# Patient Record
Sex: Male | Born: 1965 | Race: White | Hispanic: No | Marital: Single | Smoking: Current every day smoker
Health system: Southern US, Community
[De-identification: ages and names within clinical notes are randomized; demographics above are authoritative.]

## PROBLEM LIST (undated history)

## (undated) DIAGNOSIS — I4891 Unspecified atrial fibrillation: Secondary | ICD-10-CM

## (undated) DIAGNOSIS — I82409 Acute embolism and thrombosis of unspecified deep veins of unspecified lower extremity: Secondary | ICD-10-CM

## (undated) DIAGNOSIS — F1010 Alcohol abuse, uncomplicated: Secondary | ICD-10-CM

## (undated) DIAGNOSIS — I219 Acute myocardial infarction, unspecified: Secondary | ICD-10-CM

## (undated) DIAGNOSIS — F10231 Alcohol dependence with withdrawal delirium: Secondary | ICD-10-CM

## (undated) DIAGNOSIS — I739 Peripheral vascular disease, unspecified: Secondary | ICD-10-CM

## (undated) DIAGNOSIS — F10239 Alcohol dependence with withdrawal, unspecified: Secondary | ICD-10-CM

## (undated) DIAGNOSIS — I779 Disorder of arteries and arterioles, unspecified: Secondary | ICD-10-CM

## (undated) HISTORY — DX: Peripheral vascular disease, unspecified: I73.9

## (undated) HISTORY — DX: Disorder of arteries and arterioles, unspecified: I77.9

## (undated) HISTORY — PX: SHOULDER SURGERY: SHX246

---
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
## 2017-06-02 DIAGNOSIS — I219 Acute myocardial infarction, unspecified: Secondary | ICD-10-CM

## 2017-06-02 DIAGNOSIS — I252 Old myocardial infarction: Secondary | ICD-10-CM | POA: Insufficient documentation

## 2017-06-02 HISTORY — DX: Acute myocardial infarction, unspecified: I21.9

## 2018-08-03 DIAGNOSIS — K922 Gastrointestinal hemorrhage, unspecified: Secondary | ICD-10-CM | POA: Insufficient documentation

## 2018-08-03 DIAGNOSIS — F10231 Alcohol dependence with withdrawal delirium: Secondary | ICD-10-CM | POA: Insufficient documentation

## 2018-08-03 DIAGNOSIS — F10931 Alcohol use, unspecified with withdrawal delirium: Secondary | ICD-10-CM

## 2018-08-03 DIAGNOSIS — E871 Hypo-osmolality and hyponatremia: Secondary | ICD-10-CM | POA: Insufficient documentation

## 2018-08-03 DIAGNOSIS — I2510 Atherosclerotic heart disease of native coronary artery without angina pectoris: Secondary | ICD-10-CM | POA: Insufficient documentation

## 2018-08-03 HISTORY — DX: Alcohol use, unspecified with withdrawal delirium: F10.931

## 2018-08-12 ENCOUNTER — Inpatient Hospital Stay (HOSPITAL_COMMUNITY)
Admission: EM | Admit: 2018-08-12 | Discharge: 2018-08-15 | DRG: 300 | Disposition: A | Discharge status: Home / Self Care | Payer: BLUE CROSS/BLUE SHIELD | Attending: Internal Medicine | Admitting: Internal Medicine

## 2018-08-12 ENCOUNTER — Emergency Department (HOSPITAL_COMMUNITY): Payer: BLUE CROSS/BLUE SHIELD

## 2018-08-12 ENCOUNTER — Other Ambulatory Visit: Payer: Self-pay

## 2018-08-12 ENCOUNTER — Encounter (HOSPITAL_COMMUNITY): Payer: Self-pay

## 2018-08-12 DIAGNOSIS — Z720 Tobacco use: Secondary | ICD-10-CM | POA: Diagnosis not present

## 2018-08-12 DIAGNOSIS — G8929 Other chronic pain: Secondary | ICD-10-CM | POA: Diagnosis present

## 2018-08-12 DIAGNOSIS — F10239 Alcohol dependence with withdrawal, unspecified: Secondary | ICD-10-CM | POA: Diagnosis present

## 2018-08-12 DIAGNOSIS — R000 Tachycardia, unspecified: Secondary | ICD-10-CM | POA: Diagnosis present

## 2018-08-12 DIAGNOSIS — F1010 Alcohol abuse, uncomplicated: Secondary | ICD-10-CM | POA: Diagnosis not present

## 2018-08-12 DIAGNOSIS — Z8719 Personal history of other diseases of the digestive system: Secondary | ICD-10-CM | POA: Diagnosis not present

## 2018-08-12 DIAGNOSIS — I82409 Acute embolism and thrombosis of unspecified deep veins of unspecified lower extremity: Secondary | ICD-10-CM | POA: Diagnosis present

## 2018-08-12 DIAGNOSIS — J449 Chronic obstructive pulmonary disease, unspecified: Secondary | ICD-10-CM | POA: Diagnosis present

## 2018-08-12 DIAGNOSIS — F17210 Nicotine dependence, cigarettes, uncomplicated: Secondary | ICD-10-CM | POA: Diagnosis present

## 2018-08-12 DIAGNOSIS — I252 Old myocardial infarction: Secondary | ICD-10-CM

## 2018-08-12 DIAGNOSIS — I82452 Acute embolism and thrombosis of left peroneal vein: Secondary | ICD-10-CM | POA: Diagnosis not present

## 2018-08-12 DIAGNOSIS — R079 Chest pain, unspecified: Secondary | ICD-10-CM | POA: Diagnosis present

## 2018-08-12 DIAGNOSIS — Z79899 Other long term (current) drug therapy: Secondary | ICD-10-CM

## 2018-08-12 DIAGNOSIS — F10230 Alcohol dependence with withdrawal, uncomplicated: Secondary | ICD-10-CM | POA: Diagnosis present

## 2018-08-12 DIAGNOSIS — I82462 Acute embolism and thrombosis of left calf muscular vein: Secondary | ICD-10-CM | POA: Diagnosis present

## 2018-08-12 DIAGNOSIS — K208 Other esophagitis: Secondary | ICD-10-CM | POA: Diagnosis present

## 2018-08-12 DIAGNOSIS — F10939 Alcohol use, unspecified with withdrawal, unspecified: Secondary | ICD-10-CM | POA: Diagnosis present

## 2018-08-12 DIAGNOSIS — F10930 Alcohol use, unspecified with withdrawal, uncomplicated: Secondary | ICD-10-CM

## 2018-08-12 DIAGNOSIS — I4891 Unspecified atrial fibrillation: Secondary | ICD-10-CM | POA: Diagnosis present

## 2018-08-12 HISTORY — DX: Alcohol abuse, uncomplicated: F10.10

## 2018-08-12 HISTORY — DX: Acute myocardial infarction, unspecified: I21.9

## 2018-08-12 HISTORY — DX: Unspecified atrial fibrillation: I48.91

## 2018-08-12 LAB — COMPREHENSIVE METABOLIC PANEL
ALBUMIN: 3.9 g/dL (ref 3.5–5.0)
ALT: 18 U/L (ref 0–44)
ANION GAP: 10 (ref 5–15)
AST: 16 U/L (ref 15–41)
Alkaline Phosphatase: 73 U/L (ref 38–126)
BUN: 6 mg/dL (ref 6–20)
CHLORIDE: 106 mmol/L (ref 98–111)
CO2: 24 mmol/L (ref 22–32)
Calcium: 9 mg/dL (ref 8.9–10.3)
Creatinine, Ser: 0.72 mg/dL (ref 0.61–1.24)
GFR calc Af Amer: >60 mL/min (ref >60)
GFR calc non Af Amer: >60 mL/min (ref >60)
Glucose, Bld: 94 mg/dL (ref 70–99)
Potassium: 4.7 mmol/L (ref 3.5–5.1)
Sodium: 140 mmol/L (ref 135–145)
Total Bilirubin: 0.5 mg/dL (ref 0.3–1.2)
Total Protein: 7.1 g/dL (ref 6.5–8.1)

## 2018-08-12 LAB — CBC WITH DIFFERENTIAL/PLATELET
Abs Immature Granulocytes: 0.02 10*3/uL (ref 0.00–0.07)
BASOS ABS: 0 10*3/uL (ref 0.0–0.1)
Basophils Relative: 1 %
Eosinophils Absolute: 0.1 10*3/uL (ref 0.0–0.5)
Eosinophils Relative: 1 %
HCT: 32.8 % — ABNORMAL LOW (ref 39.0–52.0)
Hemoglobin: 10.5 g/dL — ABNORMAL LOW (ref 13.0–17.0)
Immature Granulocytes: 0 %
Lymphocytes Relative: 34 %
Lymphs Abs: 2.6 10*3/uL (ref 0.7–4.0)
MCH: 31.4 pg (ref 26.0–34.0)
MCHC: 32 g/dL (ref 30.0–36.0)
MCV: 98.2 fL (ref 80.0–100.0)
Monocytes Absolute: 0.6 10*3/uL (ref 0.1–1.0)
Monocytes Relative: 7 %
NRBC: 0 % (ref 0.0–0.2)
Neutro Abs: 4.4 10*3/uL (ref 1.7–7.7)
Neutrophils Relative %: 57 %
Platelets: 478 10*3/uL — ABNORMAL HIGH (ref 150–400)
RBC: 3.34 MIL/uL — AB (ref 4.22–5.81)
RDW: 14 % (ref 11.5–15.5)
WBC: 7.6 10*3/uL (ref 4.0–10.5)

## 2018-08-12 LAB — POCT I-STAT TROPONIN I: Troponin i, poc: 0 ng/mL (ref 0.00–0.08)

## 2018-08-12 LAB — TROPONIN I: Troponin I: <0.03 ng/mL (ref <0.03)

## 2018-08-12 LAB — PROTIME-INR
INR: 0.95
Prothrombin Time: 12.5 seconds (ref 11.4–15.2)

## 2018-08-12 LAB — BRAIN NATRIURETIC PEPTIDE: B NATRIURETIC PEPTIDE 5: 60.4 pg/mL (ref 0.0–100.0)

## 2018-08-12 LAB — ETHANOL: ALCOHOL ETHYL (B): 80 mg/dL — AB (ref <10)

## 2018-08-12 MED ORDER — ONDANSETRON HCL 4 MG PO TABS
4.0000 mg | ORAL_TABLET | Freq: Four times a day (QID) | ORAL | Status: DC | PRN
  Filled 2018-08-12: qty 1

## 2018-08-12 MED ORDER — LORAZEPAM 1 MG PO TABS
1.0000 mg | ORAL_TABLET | Freq: Four times a day (QID) | ORAL | Status: DC | PRN
  Filled 2018-08-12: qty 1

## 2018-08-12 MED ORDER — FOLIC ACID 1 MG PO TABS
1.0000 mg | ORAL_TABLET | Freq: Every day | ORAL | Status: DC
  Administered 2018-08-13 – 2018-08-15 (×3): 1 mg via ORAL
  Filled 2018-08-12 (×3): qty 1

## 2018-08-12 MED ORDER — ADULT MULTIVITAMIN W/MINERALS CH
1.0000 | ORAL_TABLET | Freq: Every day | ORAL | Status: DC
  Administered 2018-08-13 – 2018-08-15 (×3): 1 via ORAL
  Filled 2018-08-12 (×3): qty 1

## 2018-08-12 MED ORDER — VITAMIN B-1 100 MG PO TABS
100.0000 mg | ORAL_TABLET | Freq: Every day | ORAL | Status: DC
  Administered 2018-08-12 – 2018-08-15 (×4): 100 mg via ORAL
  Filled 2018-08-12 (×4): qty 1

## 2018-08-12 MED ORDER — PANTOPRAZOLE SODIUM 40 MG PO TBEC
40.0000 mg | DELAYED_RELEASE_TABLET | Freq: Two times a day (BID) | ORAL | Status: DC
  Administered 2018-08-13 – 2018-08-15 (×6): 40 mg via ORAL
  Filled 2018-08-12 (×6): qty 1

## 2018-08-12 MED ORDER — HEPARIN BOLUS VIA INFUSION
3000.0000 [IU] | Freq: Once | INTRAVENOUS | Status: AC
  Administered 2018-08-12: 3000 [IU] via INTRAVENOUS
  Filled 2018-08-12: qty 3000

## 2018-08-12 MED ORDER — LORAZEPAM 2 MG/ML IJ SOLN
0.0000 mg | Freq: Four times a day (QID) | INTRAMUSCULAR | Status: AC
  Administered 2018-08-12 – 2018-08-13 (×3): 2 mg via INTRAVENOUS
  Administered 2018-08-13: 1 mg via INTRAVENOUS
  Administered 2018-08-14 (×2): 2 mg via INTRAVENOUS
  Filled 2018-08-12 (×6): qty 1

## 2018-08-12 MED ORDER — THIAMINE HCL 100 MG/ML IJ SOLN
100.0000 mg | Freq: Every day | INTRAMUSCULAR | Status: DC
  Filled 2018-08-12: qty 2

## 2018-08-12 MED ORDER — LORAZEPAM 2 MG/ML IJ SOLN
1.0000 mg | Freq: Four times a day (QID) | INTRAMUSCULAR | Status: DC | PRN
  Administered 2018-08-14: 1 mg via INTRAVENOUS
  Filled 2018-08-12: qty 1

## 2018-08-12 MED ORDER — IOPAMIDOL (ISOVUE-370) INJECTION 76%
100.0000 mL | Freq: Once | INTRAVENOUS | Status: AC | PRN
  Administered 2018-08-12: 100 mL via INTRAVENOUS

## 2018-08-12 MED ORDER — HYDROCODONE-ACETAMINOPHEN 5-325 MG PO TABS
1.0000 | ORAL_TABLET | ORAL | Status: DC | PRN
  Administered 2018-08-13: 1 via ORAL
  Administered 2018-08-13 – 2018-08-14 (×3): 2 via ORAL
  Filled 2018-08-12: qty 2
  Filled 2018-08-12: qty 1
  Filled 2018-08-12 (×2): qty 2

## 2018-08-12 MED ORDER — SUCRALFATE 1 GM/10ML PO SUSP
1.0000 g | Freq: Three times a day (TID) | ORAL | Status: DC
  Administered 2018-08-13 – 2018-08-15 (×8): 1 g via ORAL
  Filled 2018-08-12 (×7): qty 10

## 2018-08-12 MED ORDER — ACETAMINOPHEN 325 MG PO TABS: 650.0000 mg | ORAL_TABLET | Freq: Four times a day (QID) | ORAL | Status: DC | PRN

## 2018-08-12 MED ORDER — ACETAMINOPHEN 650 MG RE SUPP: 650.0000 mg | Freq: Four times a day (QID) | RECTAL | Status: DC | PRN

## 2018-08-12 MED ORDER — LORAZEPAM 1 MG PO TABS
0.0000 mg | ORAL_TABLET | Freq: Four times a day (QID) | ORAL | Status: AC
  Administered 2018-08-13: 1 mg via ORAL

## 2018-08-12 MED ORDER — LORAZEPAM 2 MG/ML IJ SOLN: 0.0000 mg | Freq: Two times a day (BID) | INTRAMUSCULAR | Status: DC

## 2018-08-12 MED ORDER — ALBUTEROL SULFATE (2.5 MG/3ML) 0.083% IN NEBU: 2.5000 mg | INHALATION_SOLUTION | RESPIRATORY_TRACT | Status: DC | PRN

## 2018-08-12 MED ORDER — LORAZEPAM 1 MG PO TABS: 0.0000 mg | ORAL_TABLET | Freq: Two times a day (BID) | ORAL | Status: DC

## 2018-08-12 MED ORDER — ONDANSETRON HCL 4 MG/2ML IJ SOLN: 4.0000 mg | Freq: Four times a day (QID) | INTRAMUSCULAR | Status: DC | PRN

## 2018-08-12 MED ORDER — HEPARIN (PORCINE) 25000 UT/250ML-% IV SOLN
1400.0000 [IU]/h | INTRAVENOUS | Status: DC
  Administered 2018-08-12 – 2018-08-13 (×2): 1400 [IU]/h via INTRAVENOUS
  Filled 2018-08-12 (×3): qty 250

## 2018-08-12 NOTE — ED Triage Notes (Signed)
Patient is requesting alcohol detox. Patient's last drink of alcohol was 1 1/2 hours ago. patient states he drank a 24 ounce beer at that time.  Patient c/o left foot pain x 2 days. Patient states he woke up and his left foot was hurting.

## 2018-08-12 NOTE — ED Provider Notes (Signed)
[facility]-EMERGENCY DEPT Provider Note   CSN: [id number] Arrival date & time: 08/12/18  1710     History   Chief Complaint Chief Complaint  Patient presents with  . alcohol detox  . Foot Pain    HPI Randall Simpson is a 53 y.o. male.  HPI   53 year old male with a history of alcohol abuse, with alcohol withdrawal seizures and recent admission to [facility] regional Monday through Friday of last week, presents with concern for foot pain and desire for alcohol detox.  Provider at [facility] regional called regarding a DVT study that have been done earlier today.  Patient reports that he had gone to [facility] regional with distal concern of increasing left foot pain and swelling, and had an ultrasound done.  He was initially discharged with preliminary read showing no acute findings, however the provider at [facility] regional called me to discuss that he does have an acute DVTs. (I believe soleal and peroneal vein)   Reports that the left-sided swelling began on Monday, has been present for 3 days.  Reports that initially he had pain in his left foot, but at this point it has extended into his left calf.  Reports it severe.  Reports that because of the pain he has increased his drinking.  Reports he had been admitted to [facility] regional for alcohol withdrawal, was discharged, however began drinking again.  Reports his last drink was 5 PM.  Reports while he did have a drink today, he already feels symptoms of anxiety, shaking, full body paresthesias, nausea and headache.  Denies visual or auditory hallucinations.  Reports that he "always" has chest pain and shortness of breath.   Past Medical History:  Diagnosis Date  . Atrial fibrillation (HCC)   . ETOH abuse   . MI (myocardial infarction) [facility])     Patient Active Problem List   Diagnosis Date Noted  . DVT (deep venous thrombosis) (HCC) 08/12/2018  . Chest pain 08/12/2018  . Alcohol abuse 08/12/2018  .  Tobacco abuse 08/12/2018  . History of hematemesis 08/12/2018    History reviewed. No pertinent surgical history.      Home Medications    Prior to Admission medications   Medication Sig Start Date End Date Taking? Authorizing Provider  acetaminophen (TYLENOL) 500 MG tablet Take 1,000 mg by mouth every 6 (six) hours as needed for mild pain, moderate pain or headache.   Yes [provider]  pantoprazole (PROTONIX) 40 MG tablet Take 40 mg by mouth daily. 08/07/18  Yes [provider]    Family History Family History  Problem Relation Age of Onset  . Cancer Mother   . Cancer Father     Social History Social History   Tobacco Use  . Smoking status: Current Every Day Smoker    Packs/day: 0.50    Types: Cigarettes  . Smokeless tobacco: Never Used  Substance Use Topics  . Alcohol use: Yes    Comment: daily use-8 pack of wine and several pints of beer and vodka   . Drug use: Never     Allergies   Patient has no known allergies.   Review of Systems Review of Systems  Constitutional: Negative for fever.  HENT: Negative for sore throat.   Eyes: Negative for visual disturbance.  Respiratory: Positive for shortness of breath.   Cardiovascular: Positive for chest pain.  Gastrointestinal: Positive for nausea. Negative for abdominal pain and vomiting.  Genitourinary: Negative for difficulty urinating.  Musculoskeletal: Positive for arthralgias. Negative for back pain and neck stiffness.  Skin: Negative for rash.  Neurological: Positive for headaches. Negative for seizures and syncope.     Physical Exam Updated Vital Signs BP 100/69 (BP Location: Left Arm)   Pulse 82   Temp 98.8 F (37.1 C) (Oral)   Resp 20   Ht 5\' 10"  (1.778 m)   Wt 72 kg   SpO2 97%   BMI 22.78 kg/m   Physical Exam Vitals signs and nursing note reviewed.  Constitutional:      General: He is not in acute distress.    Appearance: He is well-developed. He is not diaphoretic.    HENT:     Head: Normocephalic and atraumatic.  Eyes:     Conjunctiva/sclera: Conjunctivae normal.  Neck:     Musculoskeletal: Normal range of motion.  Cardiovascular:     Rate and Rhythm: Regular rhythm. Tachycardia present.     Heart sounds: Normal heart sounds. No murmur. No friction rub. No gallop.   Pulmonary:     Effort: Pulmonary effort is normal. No respiratory distress.     Breath sounds: Normal breath sounds. No wheezing or rales.  Abdominal:     General: There is no distension.     Palpations: Abdomen is soft.     Tenderness: There is no abdominal tenderness. There is no guarding.  Skin:    General: Skin is warm and dry.  Neurological:     Mental Status: He is alert and oriented to person, place, and time.     Motor: Tremor present.      ED Treatments / Results  Labs (all labs ordered are listed, but only abnormal results are displayed) Labs Reviewed  CBC WITH DIFFERENTIAL/PLATELET - Abnormal; Notable for the following components:      Result Value   RBC 3.34 (*)    Hemoglobin 10.5 (*)    HCT 32.8 (*)    Platelets 478 (*)    All other components within normal limits  ETHANOL - Abnormal; Notable for the following components:   Alcohol, Ethyl (B) 80 (*)    All other components within normal limits  CBC - Abnormal; Notable for the following components:   RBC 3.26 (*)    Hemoglobin 9.9 (*)    HCT 31.4 (*)    Platelets 471 (*)    All other components within normal limits  COMPREHENSIVE METABOLIC PANEL - Abnormal; Notable for the following components:   Calcium 8.8 (*)    Total Protein 6.3 (*)    All other components within normal limits  COMPREHENSIVE METABOLIC PANEL  PROTIME-INR  BRAIN NATRIURETIC PEPTIDE  TROPONIN I  TROPONIN I  TROPONIN I  HEPARIN LEVEL (UNFRACTIONATED)  HIV ANTIBODY (ROUTINE TESTING W REFLEX)  MAGNESIUM  PHOSPHORUS  TSH  HEPARIN LEVEL (UNFRACTIONATED)  I-STAT TROPONIN, ED  POCT I-STAT TROPONIN I  TYPE AND SCREEN  ABO/RH     EKG None  Radiology Ct Angio Chest Pe W And/or Wo Contrast  Result Date: 08/12/2018 CLINICAL DATA:  Chest pain, possible pulmonary emboli EXAM: CT ANGIOGRAPHY CHEST WITH CONTRAST TECHNIQUE: Multidetector CT imaging of the chest was performed using the standard protocol during bolus administration of intravenous contrast. Multiplanar CT image reconstructions and MIPs were obtained to evaluate the vascular anatomy. CONTRAST:  100mL ISOVUE-370 COMPARISON:  None. FINDINGS: Cardiovascular: Thoracic aorta demonstrates no aneurysmal dilatation or dissection. No cardiac enlargement is seen. No coronary calcifications are noted. Pulmonary artery is within normal limits with a normal  branching pattern. No filling defect to suggest pulmonary embolism is identified. Mediastinum/Nodes: Thoracic inlet is within normal limits. No hilar or mediastinal adenopathy is noted. The esophagus is normal limits. Lungs/Pleura: Lungs are well aerated bilaterally. Calcified granuloma is noted in the right upper lobe. No effusion or focal infiltrate is seen. Upper Abdomen: Visualized upper abdomen is unremarkable. Musculoskeletal: Degenerative changes of the thoracic spine are noted. Review of the MIP images confirms the above findings. IMPRESSION: No evidence of pulmonary emboli. Changes consistent with prior granulomatous disease. No acute abnormality noted. Electronically Signed   By: Alcide Clever M.D.   On: 08/12/2018 21:42   Dg Foot Complete Left  Result Date: 08/12/2018 CLINICAL DATA:  23 old male history of pain in the left foot with no injury. EXAM: LEFT FOOT - COMPLETE 3+ VIEW COMPARISON:  None. FINDINGS: There is no evidence of fracture or dislocation. There is no evidence of arthropathy or other focal bone abnormality. Soft tissues are unremarkable. IMPRESSION: Negative. Electronically Signed   By: Gilmer Mor D.O.   On: 08/12/2018 19:06    Procedures Procedures (including critical care  time)  Medications Ordered in ED Medications  LORazepam (ATIVAN) injection 0-4 mg (2 mg Intravenous Given 08/13/18 1423)    Or  LORazepam (ATIVAN) tablet 0-4 mg ( Oral See Alternative 08/13/18 1423)  LORazepam (ATIVAN) injection 0-4 mg (has no administration in time range)    Or  LORazepam (ATIVAN) tablet 0-4 mg (has no administration in time range)  thiamine (VITAMIN B-1) tablet 100 mg (100 mg Oral Given 08/13/18 1046)    Or  thiamine (B-1) injection 100 mg ( Intravenous See Alternative 08/13/18 1046)  LORazepam (ATIVAN) tablet 1 mg (has no administration in time range)    Or  LORazepam (ATIVAN) injection 1 mg (has no administration in time range)  folic acid (FOLVITE) tablet 1 mg (1 mg Oral Given 08/13/18 1046)  multivitamin with minerals tablet 1 tablet (1 tablet Oral Given 08/13/18 1046)  heparin ADULT infusion 100 units/mL (25000 units/259mL sodium chloride 0.45%) (1,400 Units/hr Intravenous New Bag/Given 08/13/18 1415)  pantoprazole (PROTONIX) EC tablet 40 mg (40 mg Oral Given 08/13/18 1046)  acetaminophen (TYLENOL) tablet 650 mg (has no administration in time range)    Or  acetaminophen (TYLENOL) suppository 650 mg (has no administration in time range)  HYDROcodone-acetaminophen (NORCO/VICODIN) 5-325 MG per tablet 1-2 tablet (2 tablets Oral Given 08/13/18 1046)  ondansetron (ZOFRAN) tablet 4 mg (has no administration in time range)    Or  ondansetron (ZOFRAN) injection 4 mg (has no administration in time range)  albuterol (PROVENTIL) (2.5 MG/3ML) 0.083% nebulizer solution 2.5 mg (has no administration in time range)  sucralfate (CARAFATE) 1 GM/10ML suspension 1 g (1 g Oral Not Given 08/13/18 1425)  nicotine (NICODERM CQ - dosed in mg/24 hours) patch 21 mg (21 mg Transdermal Patch Applied 08/13/18 1046)  iopamidol (ISOVUE-370) 76 % injection 100 mL (100 mLs Intravenous Contrast Given 08/12/18 2128)  heparin bolus via infusion 3,000 Units (3,000 Units Intravenous Bolus from Bag 08/12/18 2317)      Initial Impression / Assessment and Plan / ED Course  I have reviewed the triage vital signs and the nursing notes.  Pertinent labs & imaging results that were available during my care of the patient were reviewed by me and considered in my medical decision making (see chart for details).     53 year old male with a history of alcohol abuse, with alcohol withdrawal seizures and recent admission to [facility] regional Monday  through Friday of last week, presents with concern for foot pain and desire for alcohol detox.  [place] regional provider called the emergency department, reports a DVT study that was done earlier today at [facility] regional shows acute DVT.  Patient has tachycardia on exam, describes chest pain and shortness of breath, order CT PE study for further evaluation.   I am concerned regarding the risk of anticoagulation on a patient that has a history of etoh withdrawal seizures when he stops drinking, and risk of falls if he continues. He would like detox from etoh. His current CIWA is a 10.  In addition, he was recently admitted with concern for hematemesis at [facility]--he does not believe he has esophageal varices, has never received blood transfusion, reportedly erosive esophagitis.  Will admit to initiate anticoagulation for DVT and monitor in setting of risks of etoh use, withdrawal and recent bleed.  Final Clinical Impressions(s) / ED Diagnoses   Final diagnoses:  Acute deep vein thrombosis (DVT) of left peroneal vein  Alcohol withdrawal syndrome without complication [facility])    ED Discharge Orders    None       Alvira Monday, MD 08/13/18 1451

## 2018-08-12 NOTE — ED Notes (Addendum)
Pt. Documented in error DG Foot Complete Left.

## 2018-08-12 NOTE — ED Notes (Signed)
ED TO INPATIENT HANDOFF REPORT  Name/Age/Gender Randall Simpson 53 y.o. male  Code Status    Code Status Orders  (From admission, onward)         Start     Ordered   08/12/18 2022  Full code  Continuous     08/12/18 2022        Code Status History    This patient has a current code status but no historical code status.      Home/SNF/Other Home  Chief Complaint alcohol detox, left foot pains  Level of Care/Admitting Diagnosis ED Disposition    ED Disposition Condition Comment   Admit  Hospital Area: [facility] [[id number]]  Level of Care: Telemetry [5]  Admit to tele based on following criteria: Monitor for Ischemic changes  Diagnosis: DVT (deep venous thrombosis) (HCC) [595638]  Admitting Physician: Therisa Doyne [[id number]]  Attending Physician: Therisa Doyne [[id number]]  PT Class (Do Not Modify): Observation [104]  PT Acc Code (Do Not Modify): Observation [10022]       Medical History Past Medical History:  Diagnosis Date  . Atrial fibrillation (HCC)   . ETOH abuse   . MI (myocardial infarction) (HCC)     Allergies No Known Allergies  IV Location/Drains/Wounds Patient Lines/Drains/Airways Status   Active Line/Drains/Airways    Name:   Placement date:   Placement time:   Site:   Days:   Peripheral IV 08/12/18 Right Antecubital   08/12/18    2016    Antecubital   less than 1          Labs/Imaging Results for orders placed or performed during the hospital encounter of 08/12/18 (from the past 48 hour(s))  CBC with Differential     Status: Abnormal   Collection Time: 08/12/18  8:02 PM  Result Value Ref Range   WBC 7.6 4.0 - 10.5 K/uL   RBC 3.34 (L) 4.22 - 5.81 MIL/uL   Hemoglobin 10.5 (L) 13.0 - 17.0 g/dL   HCT 75.6 (L) 43.3 - 29.5 %   MCV 98.2 80.0 - 100.0 fL   MCH 31.4 26.0 - 34.0 pg   MCHC 32.0 30.0 - 36.0 g/dL   RDW 18.8 41.6 - 60.6 %   Platelets 478 (H) 150 - 400 K/uL   nRBC 0.0 0.0 - 0.2 %   Neutrophils Relative %  57 %   Neutro Abs 4.4 1.7 - 7.7 K/uL   Lymphocytes Relative 34 %   Lymphs Abs 2.6 0.7 - 4.0 K/uL   Monocytes Relative 7 %   Monocytes Absolute 0.6 0.1 - 1.0 K/uL   Eosinophils Relative 1 %   Eosinophils Absolute 0.1 0.0 - 0.5 K/uL   Basophils Relative 1 %   Basophils Absolute 0.0 0.0 - 0.1 K/uL   Immature Granulocytes 0 %   Abs Immature Granulocytes 0.02 0.00 - 0.07 K/uL    Comment: Performed at [facility], [address], [place], [place] [address]  Comprehensive metabolic panel     Status: None   Collection Time: 08/12/18  8:02 PM  Result Value Ref Range   Sodium 140 135 - 145 mmol/L   Potassium 4.7 3.5 - 5.1 mmol/L   Chloride 106 98 - 111 mmol/L   CO2 24 22 - 32 mmol/L   Glucose, Bld 94 70 - 99 mg/dL   BUN 6 6 - 20 mg/dL   Creatinine, Ser 1.09 0.61 - 1.24 mg/dL   Calcium 9.0 8.9 - 32.3 mg/dL   Total  Protein 7.1 6.5 - 8.1 g/dL   Albumin 3.9 3.5 - 5.0 g/dL   AST 16 15 - 41 U/L   ALT 18 0 - 44 U/L   Alkaline Phosphatase 73 38 - 126 U/L   Total Bilirubin 0.5 0.3 - 1.2 mg/dL   GFR calc non Af Amer >60 >60 mL/min   GFR calc Af Amer >60 >60 mL/min   Anion gap 10 5 - 15    Comment: Performed at [facility], [address], [place], [place] [address]  Protime-INR     Status: None   Collection Time: 08/12/18  8:02 PM  Result Value Ref Range   Prothrombin Time 12.5 11.4 - 15.2 seconds   INR 0.95     Comment: Performed at [facility], [address], [place], [place] [address]  Brain natriuretic peptide     Status: None   Collection Time: 08/12/18  8:02 PM  Result Value Ref Range   B Natriuretic Peptide 60.4 0.0 - 100.0 pg/mL    Comment: Performed at [facility], [address] [place], [place] [address]  Ethanol     Status: Abnormal   Collection Time: 08/12/18  8:02 PM  Result Value Ref Range   Alcohol, Ethyl (B) 80 (H) <10 mg/dL    Comment: (NOTE) Lowest detectable limit for serum alcohol is  10 mg/dL. For medical purposes only. Performed at [facility], [address] [place], [place] [address]   POCT i-Stat troponin I     Status: None   Collection Time: 08/12/18  8:22 PM  Result Value Ref Range   Troponin i, poc 0.00 0.00 - 0.08 ng/mL   Comment 3            Comment: Due to the release kinetics of cTnI, a negative result within the first hours of the onset of symptoms does not rule out myocardial infarction with certainty. If myocardial infarction is still suspected, repeat the test at appropriate intervals.    Ct Angio Chest Pe W And/or Wo Contrast  Result Date: 08/12/2018 CLINICAL DATA:  Chest pain, possible pulmonary emboli EXAM: CT ANGIOGRAPHY CHEST WITH CONTRAST TECHNIQUE: Multidetector CT imaging of the chest was performed using the standard protocol during bolus administration of intravenous contrast. Multiplanar CT image reconstructions and MIPs were obtained to evaluate the vascular anatomy. CONTRAST:  ISOVUE-370 COMPARISON:  None. FINDINGS: Cardiovascular: Thoracic aorta demonstrates no aneurysmal dilatation or dissection. No cardiac enlargement is seen. No coronary calcifications are noted. Pulmonary artery is within normal limits with a normal branching pattern. No filling defect to suggest pulmonary embolism is identified. Mediastinum/Nodes: Thoracic inlet is within normal limits. No hilar or mediastinal adenopathy is noted. The esophagus is normal limits. Lungs/Pleura: Lungs are well aerated bilaterally. Calcified granuloma is noted in the right upper lobe. No effusion or focal infiltrate is seen. Upper Abdomen: Visualized upper abdomen is unremarkable. Musculoskeletal: Degenerative changes of the thoracic spine are noted. Review of the MIP images confirms the above findings. IMPRESSION: No evidence of pulmonary emboli. Changes consistent with prior granulomatous disease. No acute abnormality noted. Electronically Signed   By: Alcide Clever  M.D.   On: 08/12/2018 21:42   Dg Foot Complete Left  Result Date: 08/12/2018 CLINICAL DATA:  69 old male history of pain in the left foot with no injury. EXAM: LEFT FOOT - COMPLETE 3+ VIEW COMPARISON:  None. FINDINGS: There is no evidence of fracture or dislocation. There is no evidence  of arthropathy or other focal bone abnormality. Soft tissues are unremarkable. IMPRESSION: Negative. Electronically Signed   By: Gilmer Mor D.O.   On: 08/12/2018 19:06    Pending Labs Unresulted Labs (From admission, onward)    Start     Ordered   08/13/18 0800  Heparin level (unfractionated)  Once-Timed,   STAT     08/12/18 2241   08/13/18 0500  CBC  Daily,   R     08/12/18 2241   08/12/18 2234  Troponin I - Now Then Q6H  Now then every 6 hours,   R     08/12/18 2233   08/12/18 2233  Type and screen [facility]  Once,   R    Comments:  [facility]    08/12/18 2232   Signed and Held  HIV antibody (Routine Testing)  Tomorrow morning,   R     Signed and Held   Signed and Held  Magnesium  Tomorrow morning,   R    Comments:  Call MD if <1.5    Signed and Held   Signed and Held  Phosphorus  Tomorrow morning,   R     Signed and Held   Signed and Held  TSH  Once,   R    Comments:  Cancel if already done within 1 month and notify MD    Signed and Held   Signed and Held  Comprehensive metabolic panel  Once,   R    Comments:  Cal MD for K<3.5 or >5.0    Signed and Held   Signed and Held  CBC  Once,   R    Comments:  Call for hg <8.0    Signed and Held          Vitals/Pain Today's Vitals   08/12/18 1759 08/12/18 2115 08/12/18 2200 08/12/18 2200  BP: 114/82 103/72 106/76 106/76  Pulse: (!) 115 87 88 89  Resp:  18 18   Temp:      TempSrc:      SpO2:  96% 96%   Weight:      Height:      PainSc:        Isolation Precautions No active isolations  Medications Medications  LORazepam (ATIVAN) injection 0-4 mg (2 mg Intravenous Given 08/12/18  2215)    Or  LORazepam (ATIVAN) tablet 0-4 mg ( Oral See Alternative 08/12/18 2215)  LORazepam (ATIVAN) injection 0-4 mg (has no administration in time range)    Or  LORazepam (ATIVAN) tablet 0-4 mg (has no administration in time range)  thiamine (VITAMIN B-1) tablet 100 mg (100 mg Oral Given 08/12/18 2215)    Or  thiamine (B-1) injection 100 mg ( Intravenous See Alternative 08/12/18 2215)  heparin ADULT infusion 100 units/mL (25000 units/230mL sodium chloride 0.45%) (1,400 Units/hr Intravenous New Bag/Given 08/12/18 2317)  iopamidol (ISOVUE-370) 76 % injection 100 mL (100 mLs Intravenous Contrast Given 08/12/18 2128)  heparin bolus via infusion 3,000 Units (3,000 Units Intravenous Bolus from Bag 08/12/18 2317)    Mobility walks

## 2018-08-12 NOTE — ED Notes (Signed)
Patient states he normally has around 10 drinks per day. He drinks wine, vodka, and beer. His last drink was at 5 pm today, he had a 24 oz beer at 5 PM. Before that he had 4 other 24 oz beers earlier today for a total of 5 24 ox beers.

## 2018-08-12 NOTE — ED Notes (Signed)
Report given to Florentina Addison, RN on 4E.

## 2018-08-12 NOTE — H&P (Signed)
Randall Simpson HBZ:[id number] DOB: [birth year] DOA: 08/12/2018     PCP: Patient, No Pcp Per   Outpatient Specialists:  NONE   Patient arrived to ER on 08/12/18 at 1710  Patient coming from: home Lives at hotel    Chief Complaint:  Chief Complaint  Patient presents with  . alcohol detox  . Foot Pain    HPI: Randall Simpson is a 53 y.o. male with medical history significant of Hematemesis secondary to severe grade D erosive esophagitis COPD, ?MI in 06/19/2018, current alcohol abuse, tobacco abuse Atrial fibrillation    Presented with   rest for alcohol detox last alcoholic drink was today.  Also reported 2-day history of left foot pain.  Had an ultrasound done Doppler showed acute DVT Been treating his pain with alcohol.  Since he has not drank anything since this morning he started to have some shakes and anxiety paresthesias nausea and headache.  No hallucinations. Recently admitted at [facility] for hematemesis and ETOH withdrawal was admitted form 3 to 7th of February   he was diagnosed with hematemesis secondary to erosive esophagitis.  Started on Protonix twice a day    Reports last week had a possible seizure he woke up in an ambulance not sure how he got there.  Reports no bleeding since last week  No recent falls since prior discharge. Had neg head CT last week    Reports have quit drinking in the past and stayed sober for months Reports  Chest pain worse with exertion has been going on for a while  Reports MI last year  Continues to smoke   Reports chronic chest pains drinking at least 6 beers a day which is less than 1 month ago when he was drinking beer and vodka and wine daily.      While in ER: HR 115 The following Work up has been ordered so far:  Orders Placed This Encounter  Procedures  . DG Foot Complete Left  . CT Angio Chest PE W and/or Wo Contrast  . CBC with Differential  . Comprehensive metabolic panel  . Protime-INR  . Brain  natriuretic peptide  . Ethanol  . Diet regular Room service appropriate? Yes; Fluid consistency: Thin  . Apply ice to affected area (if injury is <48 hours old)  . Immobilize affected extremity  . Remove jewelry  . Vital signs  . Notify physician - Call MD / PA if:  . Initiate Carrier Fluid Protocol  . Clinical institute withdrawal assessment every 6 hours X 48 hours, then every 12 hours x 48 hours  . Notify MD if CIWA-AR is greater than 10 for 2 consecutive assessments.  . May administer Ativan PO versus IV if patient is tolerating PO intake well  . Vital signs every 6 hours X 48 hours, then per unit protocol  . Full code  . Consult to hospitalist  . I-Stat Troponin, ED (not at [facility])  . POCT i-Stat troponin I  . ED EKG      Following Medications were ordered in ER: Medications  LORazepam (ATIVAN) injection 0-4 mg (has no administration in time range)    Or  LORazepam (ATIVAN) tablet 0-4 mg (has no administration in time range)  LORazepam (ATIVAN) injection 0-4 mg (has no administration in time range)    Or  LORazepam (ATIVAN) tablet 0-4 mg (has no administration in time range)  thiamine (VITAMIN B-1) tablet 100 mg (has no administration in time range)    Or  thiamine (B-1) injection 100 mg (has no administration in time range)  iopamidol (ISOVUE-370) 76 % injection 100 mL (100 mLs Intravenous Contrast Given 08/12/18 2128)    Significant initial  Findings: Abnormal Labs Reviewed  CBC WITH DIFFERENTIAL/PLATELET - Abnormal; Notable for the following components:      Result Value   RBC 3.34 (*)    Hemoglobin 10.5 (*)    HCT 32.8 (*)    Platelets 478 (*)    All other components within normal limits  ETHANOL - Abnormal; Notable for the following components:   Alcohol, Ethyl (B) 80 (*)    All other components within normal limits    Trop 0.0  Lactic Acid, Venous No results found for: LATICACIDVEN  Na 140 K 4.7  Cr    Stable,  Lab Results  Component Value Date    CREATININE 0.72 08/12/2018     WBC  7.6  HG/HCT  stable,       Component Value Date/Time   HGB 10.5 (L) 08/12/2018 2002   HCT 32.8 (L) 08/12/2018 2002      Troponin (Point of Care Test) Recent Labs    08/12/18 2022  TROPIPOC 0.00     BNP (last 3 results) Recent Labs    08/12/18 2002  BNP 60.4    ProBNP (last 3 results) No results for input(s): PROBNP in the last 8760 hours.   UA  not ordered    CT A CHEST NO PE LEFT foot neg C   ECG:  Personally reviewed by me showing: HR : 81 Rhythm:  NSR   no evidence of ischemic changes QTC 407      ED Triage Vitals  Enc Vitals Group     BP 08/12/18 1735 114/82     Pulse Rate 08/12/18 1735 (!) 115     Resp 08/12/18 1735 20     Temp 08/12/18 1735 99.2 F (37.3 C)     Temp Source 08/12/18 1735 Oral     SpO2 08/12/18 1735 100 %     Weight 08/12/18 1753 165 lb (74.8 kg)     Height 08/12/18 1753 5\' 10"  (1.778 m)     Head Circumference --      Peak Flow --      Pain Score 08/12/18 1753 10     Pain Loc --      Pain Edu? --      Excl. in GC? --   TMAX(24)@       Latest  Blood pressure 106/76, pulse 89, temperature 99.2 F (37.3 C), temperature source Oral, resp. rate 18, height 5\' 10"  (1.778 m), weight 74.8 kg, SpO2 96 %.     Hospitalist was called for admission for DVT and ETOH ABUSE with recent admission for hematemesis   Review of Systems:    Pertinent positives include: leg pain anxiety tremor   Constitutional:  No weight loss, night sweats, Fevers, chills, fatigue, weight loss  HEENT:  No headaches, Difficulty swallowing,Tooth/dental problems,Sore throat,  No sneezing, itching, ear ache, nasal congestion, post nasal drip,  Cardio-vascular:  No chest pain, Orthopnea, PND, anasarca, dizziness, palpitations.no Bilateral lower extremity swelling  GI:  No heartburn, indigestion, abdominal pain, nausea, vomiting, diarrhea, change in bowel habits, loss of appetite, melena, blood in stool, hematemesis Resp:    no shortness of breath at rest. No dyspnea on exertion, No excess mucus, no productive cough, No non-productive cough, No coughing up of blood.No change in color of mucus.No wheezing. Skin:  no rash  or lesions. No jaundice GU:  no dysuria, change in color of urine, no urgency or frequency. No straining to urinate.  No flank pain.  Musculoskeletal:  No joint pain or no joint swelling. No decreased range of motion. No back pain.  Psych:  No change in mood or affect. No depression or anxiety. No memory loss.  Neuro: no localizing neurological complaints, no tingling, no weakness, no double vision, no gait abnormality, no slurred speech, no confusion  All systems reviewed and apart from HOPI all are negative  Past Medical History:   Past Medical History:  Diagnosis Date  . Atrial fibrillation (HCC)   . ETOH abuse   . MI (myocardial infarction) (HCC)       History reviewed. No pertinent surgical history.  Social History:  Ambulatory  independently      reports that he has been smoking cigarettes. He has been smoking about 0.50 packs per day. He has never used smokeless tobacco. He reports current alcohol use. He reports that he does not use drugs.     Family History:  Family History  Problem Relation Age of Onset  . Cancer Mother   . Cancer Father     Allergies: No Known Allergies   Prior to Admission medications   Medication Sig Start Date End Date Taking? Authorizing Provider  acetaminophen (TYLENOL) 500 MG tablet Take 1,000 mg by mouth every 6 (six) hours as needed for mild pain, moderate pain or headache.   Yes [provider]  pantoprazole (PROTONIX) 40 MG tablet Take 40 mg by mouth daily. 08/07/18  Yes [provider]   Physical Exam: Blood pressure 106/76, pulse 89, temperature 99.2 F (37.3 C), temperature source Oral, resp. rate 18, height 5\' 10"  (1.778 m), weight 74.8 kg, SpO2 96 %. 1. General:  in No Acute distress   well  -appearing 2.  Psychological: Alert and   Oriented 3. Head/ENT:   Dry Mucous Membranes                          Head Non traumatic, neck supple                            Poor Dentition 4. SKIN:   decreased Skin turgor,  Skin clean Dry and intact no rash 5. Heart: Regular rate and rhythm no  Murmur, no Rub or gallop 6. Lungs:   no wheezes occasional  crackles   7. Abdomen: Soft,  Epigastric tenderness, Non distended  bowel sounds present 8. Lower extremities: no clubbing, cyanosis, no   edema 9. Neurologically Grossly intact, moving all 4 extremities equally  tremors 10. MSK: Normal range of motion   LABS:     Recent Labs  Lab 08/12/18 2002  WBC 7.6  NEUTROABS 4.4  HGB 10.5*  HCT 32.8*  MCV 98.2  PLT 478*   Basic Metabolic Panel: Recent Labs  Lab 08/12/18 2002  NA 140  K 4.7  CL 106  CO2 24  GLUCOSE 94  BUN 6  CREATININE 0.72  CALCIUM 9.0      Recent Labs  Lab 08/12/18 2002  AST 16  ALT 18  ALKPHOS 73  BILITOT 0.5  PROT 7.1  ALBUMIN 3.9   No results for input(s): LIPASE, AMYLASE in the last 168 hours. No results for input(s): AMMONIA in the last 168 hours.    HbA1C: No results for input(s): HGBA1C in the  last 72 hours. CBG: No results for input(s): GLUCAP in the last 168 hours.    Urine analysis: No results found for: COLORURINE, APPEARANCEUR, LABSPEC, PHURINE, GLUCOSEU, HGBUR, BILIRUBINUR, KETONESUR, PROTEINUR, UROBILINOGEN, NITRITE, LEUKOCYTESUR     Cultures: No results found for: SDES, SPECREQUEST, CULT, REPTSTATUS   Radiological Exams on Admission: Ct Angio Chest Pe W And/or Wo Contrast  Result Date: 08/12/2018 CLINICAL DATA:  Chest pain, possible pulmonary emboli EXAM: CT ANGIOGRAPHY CHEST WITH CONTRAST TECHNIQUE: Multidetector CT imaging of the chest was performed using the standard protocol during bolus administration of intravenous contrast. Multiplanar CT image reconstructions and MIPs were obtained to evaluate the vascular anatomy. CONTRAST:   ISOVUE-370 COMPARISON:  None. FINDINGS: Cardiovascular: Thoracic aorta demonstrates no aneurysmal dilatation or dissection. No cardiac enlargement is seen. No coronary calcifications are noted. Pulmonary artery is within normal limits with a normal branching pattern. No filling defect to suggest pulmonary embolism is identified. Mediastinum/Nodes: Thoracic inlet is within normal limits. No hilar or mediastinal adenopathy is noted. The esophagus is normal limits. Lungs/Pleura: Lungs are well aerated bilaterally. Calcified granuloma is noted in the right upper lobe. No effusion or focal infiltrate is seen. Upper Abdomen: Visualized upper abdomen is unremarkable. Musculoskeletal: Degenerative changes of the thoracic spine are noted. Review of the MIP images confirms the above findings. IMPRESSION: No evidence of pulmonary emboli. Changes consistent with prior granulomatous disease. No acute abnormality noted. Electronically Signed   By: Alcide Clever M.D.   On: 08/12/2018 21:42   Dg Foot Complete Left  Result Date: 08/12/2018 CLINICAL DATA:  61 old male history of pain in the left foot with no injury. EXAM: LEFT FOOT - COMPLETE 3+ VIEW COMPARISON:  None. FINDINGS: There is no evidence of fracture or dislocation. There is no evidence of arthropathy or other focal bone abnormality. Soft tissues are unremarkable. IMPRESSION: Negative. Electronically Signed   By: Gilmer Mor D.O.   On: 08/12/2018 19:06    Chart has been reviewed    Assessment/Plan  52 y.o. male with medical history significant of Hematemesis secondary to severe grade D erosive esophagitis COPD, ?MI in 06/19/2018, current alcohol abuse, tobacco abuse Atrial fibrillation  Admitted for new DVT in the setting of recent hematemesis and alcohol withdrawal  Present on Admission: . DVT (deep venous thrombosis) (HCC)-DVT in the setting of recent hospitalization.  Given recent GI bleed secondary to erosive esophagitis monitor carefully  with initiation of blood failure.  Spoke about of importance of quitting alcohol.  Would need to observe IV heparin to make sure patient does not develop any further episodes of hematemesis admission for hematemesis was last week patient since then had no further episodes  . Chest pain chronic patient states he has had recent MI.  Unclear regarding his history will cycle cardiac enzymes and monitor on telemetry, given complaints of chest pain with exertion obtain echogram to evaluate for wall motion normalities . Alcohol abuse -CIWA protocol . Tobacco abuse -  - Spoke about importance of quitting spent 5 minutes discussing options for treatment, prior attempts at quitting, and dangers of smoking  -At this point patient is    interested in quitting  - order nicotine patch   - nursing tobacco cessation protocol    Other plan as per orders.  DVT prophylaxis:  heparin    Code Status:  FULL CODE   as per patient  I had personally discussed CODE STATUS with patient    Family Communication:   Family not at  Bedside  Disposition Plan:      To home once workup is complete and patient is stable                                       Social Work  consulted                 Consults called:  none  Admission status:  Obs   Level of care    tele  For 12H     Randall Simpson 08/12/2018, 11:10 PM   [facility]     after 2 AM please page floor coverage PA If 7AM-7PM, please contact the day team taking care of the patient using [email or web address]

## 2018-08-12 NOTE — ED Notes (Signed)
EKG given to EDP,Schlossman,MD., for review. 

## 2018-08-12 NOTE — ED Notes (Signed)
Heparin bolus verified with [employer], RN.

## 2018-08-13 ENCOUNTER — Observation Stay (HOSPITAL_BASED_OUTPATIENT_CLINIC_OR_DEPARTMENT_OTHER): Payer: BLUE CROSS/BLUE SHIELD

## 2018-08-13 DIAGNOSIS — F1010 Alcohol abuse, uncomplicated: Secondary | ICD-10-CM | POA: Diagnosis not present

## 2018-08-13 DIAGNOSIS — Z8719 Personal history of other diseases of the digestive system: Secondary | ICD-10-CM | POA: Diagnosis not present

## 2018-08-13 DIAGNOSIS — R079 Chest pain, unspecified: Secondary | ICD-10-CM | POA: Diagnosis not present

## 2018-08-13 DIAGNOSIS — Z720 Tobacco use: Secondary | ICD-10-CM

## 2018-08-13 LAB — CBC
HCT: 31.4 % — ABNORMAL LOW (ref 39.0–52.0)
HEMOGLOBIN: 9.9 g/dL — AB (ref 13.0–17.0)
MCH: 30.4 pg (ref 26.0–34.0)
MCHC: 31.5 g/dL (ref 30.0–36.0)
MCV: 96.3 fL (ref 80.0–100.0)
Platelets: 471 10*3/uL — ABNORMAL HIGH (ref 150–400)
RBC: 3.26 MIL/uL — ABNORMAL LOW (ref 4.22–5.81)
RDW: 14.1 % (ref 11.5–15.5)
WBC: 5.5 10*3/uL (ref 4.0–10.5)
nRBC: 0 % (ref 0.0–0.2)

## 2018-08-13 LAB — COMPREHENSIVE METABOLIC PANEL
ALBUMIN: 3.5 g/dL (ref 3.5–5.0)
ALT: 16 U/L (ref 0–44)
AST: 15 U/L (ref 15–41)
Alkaline Phosphatase: 74 U/L (ref 38–126)
Anion gap: 6 (ref 5–15)
BUN: 9 mg/dL (ref 6–20)
CHLORIDE: 107 mmol/L (ref 98–111)
CO2: 25 mmol/L (ref 22–32)
Calcium: 8.8 mg/dL — ABNORMAL LOW (ref 8.9–10.3)
Creatinine, Ser: 0.69 mg/dL (ref 0.61–1.24)
GFR calc Af Amer: >60 mL/min (ref >60)
GFR calc non Af Amer: >60 mL/min (ref >60)
Glucose, Bld: 98 mg/dL (ref 70–99)
Potassium: 4 mmol/L (ref 3.5–5.1)
SODIUM: 138 mmol/L (ref 135–145)
Total Bilirubin: 0.8 mg/dL (ref 0.3–1.2)
Total Protein: 6.3 g/dL — ABNORMAL LOW (ref 6.5–8.1)

## 2018-08-13 LAB — TYPE AND SCREEN
ABO/RH(D): O POS
Antibody Screen: NEGATIVE

## 2018-08-13 LAB — TROPONIN I
Troponin I: <0.03 ng/mL (ref <0.03)
Troponin I: <0.03 ng/mL (ref <0.03)

## 2018-08-13 LAB — HEPARIN LEVEL (UNFRACTIONATED)
Heparin Unfractionated: 0.58 IU/mL (ref 0.30–0.70)
Heparin Unfractionated: 0.49 IU/mL (ref 0.30–0.70)

## 2018-08-13 LAB — ECHOCARDIOGRAM COMPLETE
Height: 70 in
Weight: 2539.7 oz

## 2018-08-13 LAB — MAGNESIUM: Magnesium: 2.3 mg/dL (ref 1.7–2.4)

## 2018-08-13 LAB — ABO/RH: ABO/RH(D): O POS

## 2018-08-13 LAB — TSH: TSH: 2.41 u[IU]/mL (ref 0.350–4.500)

## 2018-08-13 LAB — PHOSPHORUS: Phosphorus: 4.4 mg/dL (ref 2.5–4.6)

## 2018-08-13 LAB — HIV ANTIBODY (ROUTINE TESTING W REFLEX): HIV Screen 4th Generation wRfx: NONREACTIVE

## 2018-08-13 MED ORDER — NICOTINE 21 MG/24HR TD PT24
21.0000 mg | MEDICATED_PATCH | Freq: Every day | TRANSDERMAL | Status: DC
  Administered 2018-08-13 – 2018-08-15 (×3): 21 mg via TRANSDERMAL
  Filled 2018-08-13 (×3): qty 1

## 2018-08-13 NOTE — Progress Notes (Signed)
ANTICOAGULATION CONSULT NOTE   Pharmacy Consult for heparin Indication: acute DVT  Labs: Recent Labs    08/12/18 2002 08/12/18 2234 08/13/18 0553 08/13/18 0810 08/13/18 1016 08/13/18 1344  HGB 10.5*  --  9.9*  --   --   --   HCT 32.8*  --  31.4*  --   --   --   PLT 478*  --  471*  --   --   --   LABPROT 12.5  --   --   --   --   --   INR 0.95  --   --   --   --   --   HEPARINUNFRC  --   --   --  0.58  --  0.49  CREATININE 0.72  --  0.69  --   --   --   TROPONINI  --  <0.03 <0.03  --  <0.03  --      Assessment: 52 yoM on no anticoagulation PTA, admitted to [facility] 2/3 - 2/7 for EtOH w/drawal and hematemesis 2/2 erosive esophagitis. Presented to [facility] ED 2/12 for withdrawal and c/o L foot pain. Doppler done at [facility] showed acute DVT (per EDP notes, he was initially discharged with preliminary read showing no acute findings, however the provider at [facility] called on 2/12 to discuss that he does have acute soleal and peroneal DVTs). CTA negative for PE, and Pharmacy consulted to dose heparin infusion on admission for DVT.   Baseline INR normal, aPTT not done  Prior anticoagulation: none  Heparin dosing weight: 72 kg  Significant events:  Today, 08/13/2018:  CBC: Hgb low; Plt slightly elevated  Confirmatory heparin level therapeutic on 1400 units/hr  No bleeding or infusion issues per nursing  Goal of Therapy: Heparin level 0.3-0.7 units/ml Monitor platelets by anticoagulation protocol: Yes  Plan:  Continue heparin IV infusion at 1400 units/hr  Daily CBC and heparin level  Monitor for signs of bleeding or thrombosis   Bernadene Person, PharmD, BCPS [phone] 08/13/2018, 3:23 PM

## 2018-08-13 NOTE — Progress Notes (Signed)
ANTICOAGULATION CONSULT NOTE   Pharmacy Consult for heparin Indication: acute DVT  No Known Allergies  Patient Measurements: Height: 5\' 10"  (177.8 cm) Weight: 158 lb 11.7 oz (72 kg) IBW/kg (Calculated) : 73 Heparin Dosing Weight: 72 kg  Vital Signs: Temp: 98.8 F (37.1 C) (02/13 0503) Temp Source: Oral (02/13 0503) BP: 102/68 (02/13 0503) Pulse Rate: 78 (02/13 0503)  Labs: Recent Labs    08/12/18 2002 08/12/18 2234 08/13/18 0553 08/13/18 0810  HGB 10.5*  --  9.9*  --   HCT 32.8*  --  31.4*  --   PLT 478*  --  471*  --   LABPROT 12.5  --   --   --   INR 0.95  --   --   --   HEPARINUNFRC  --   --   --  0.58  CREATININE 0.72  --  0.69  --   TROPONINI  --  <0.03 <0.03  --     Estimated Creatinine Clearance: 107.5 mL/min (by C-G formula based on SCr of 0.69 mg/dL).   Medications:  -   Assessment: Patient is a 53 y.o recently hospitalized at [facility] a week ago for alcohol withdrawal and treated for hematemesis secondary to erosive esophagitis. He presented to the ED on 2/12 for alcohol detox and with c/o left foot pain. LE doppler at [facility] was positive for acute DVT. Heparin drip started on admission for DVT.   -2/12:  chest CTA neg for PE  Today, 08/13/2018: - first heparin level now back therapeutic at 0.58 - hgb down slightly to 9.9 - no active bleeding documented  Goal of Therapy:  Heparin level 0.3-0.7 units/ml (will aim for lower end to middle of therapeutic range d/t recent hematemesis) Monitor platelets by anticoagulation protocol: Yes   Plan:  - continue heparin drip at 1400 units/hr - recheck another heparin level at 2PM to confirm level is still within desired range before changing to daily monitoring - monitor for s/s bleeding  Randall Simpson P 08/13/2018,9:16 AM

## 2018-08-13 NOTE — Progress Notes (Signed)
PROGRESS NOTE    Randall Simpson  JKD:[id number] DOB: 04-12-1966 DOA: 08/12/2018 PCP: Patient, No Pcp Per    Brief Narrative:  53 y.o. male with medical history significant of Hematemesis secondary to severe grade D erosive esophagitis COPD, ?MI in 06/19/2018, current alcohol abuse, tobacco abuse Atrial fibrillation    Presented with   rest for alcohol detox last alcoholic drink was today.  Also reported 2-day history of left foot pain.  Had an ultrasound done Doppler showed acute DVT Been treating his pain with alcohol.  Since he has not drank anything since this morning he started to have some shakes and anxiety paresthesias nausea and headache.  No hallucinations. Recently admitted at [facility] for hematemesis and ETOH withdrawal was admitted form 3 to 7th of February   he was diagnosed with hematemesis secondary to erosive esophagitis.  Started on Protonix twice a day    Reports last week had a possible seizure he woke up in an ambulance not sure how he got there.  Reports no bleeding since last week  No recent falls since prior discharge. Had neg head CT last week    Reports have quit drinking in the past and stayed sober for months Reports  Chest pain worse with exertion has been going on for a while  Reports MI last year  Assessment & Plan:   Principal Problem:   DVT (deep venous thrombosis) (HCC) Active Problems:   Chest pain   Alcohol abuse   Tobacco abuse   History of hematemesis   . DVT (deep venous thrombosis) (HCC) - DVT in the setting of recent hospitalization.   -Noted to have recent GI bleed secondary to erosive esophagitis -Presently remains on heparin gtt, tolerating thus far. If no evidence of bleeding x 24hrs of heparin, then would consider transition to oral agent at that time  . Chest pain chronic patient states he has had recent MI.   - Serial troponin neg x 3 - 2d echo performed, reviewed. Unremarkable  . Alcohol abuse  - -Presented with CIWA inexcess of 16. -This AM, CIWA noted to be 5, still with shaking on exam -Continue CIWA protocol -Cessation done at bedside. Patient states that his recent admissions have encouraged him to quit his ETOH habits and that he is keen on quitting further alcohol abuse -Discussed with Case manager regarding providing information about ETOH resource programs  . Tobacco abuse -   - Tobacco cessation done at time of admission - Stable at present  DVT prophylaxis: Heparin gtt Code Status: Full Family Communication: Pt in room, family not at bedside Disposition Plan: Uncertain at this time  Consultants:     Procedures:     Antimicrobials: Anti-infectives (From admission, onward)   None       Subjective: Still shaking this AM  Objective: Vitals:   08/12/18 2347 08/12/18 2348 08/13/18 0503 08/13/18 1325  BP: 118/76  102/68 100/69  Pulse: 80  78 82  Resp: 19  17 20   Temp: 98.9 F (37.2 C)  98.8 F (37.1 C) 98.8 F (37.1 C)  TempSrc: Oral  Oral Oral  SpO2: 98%  97% 97%  Weight:  72 kg    Height:  5\' 10"  (1.778 m)      Intake/Output Summary (Last 24 hours) at 08/13/2018 1353 Last data filed at 08/13/2018 1319 Gross per 24 hour  Intake 1127.14 ml  Output 875 ml  Net 252.14 ml   Filed Weights   08/12/18 1753 08/12/18 2348  Weight: 74.8 kg 72 kg    Examination:  General exam: Appears calm and comfortable  Respiratory system: Clear to auscultation. Respiratory effort normal. Cardiovascular system: S1 & S2 heard, RRR. Gastrointestinal system: Abdomen is nondistended, soft and nontender. No organomegaly or masses felt. Normal bowel sounds heard. Central nervous system: Alert and oriented. No focal neurological deficits. Resting tremor noted Extremities: Symmetric 5 x 5 power. Skin: No rashes, lesions Psychiatry: Judgement and insight appear normal. Mood & affect appropriate.   Data Reviewed: I have personally reviewed following labs and imaging  studies  CBC: Recent Labs  Lab 08/12/18 2002 08/13/18 0553  WBC 7.6 5.5  NEUTROABS 4.4  --   HGB 10.5* 9.9*  HCT 32.8* 31.4*  MCV 98.2 96.3  PLT 478* 471*   Basic Metabolic Panel: Recent Labs  Lab 08/12/18 2002 08/13/18 0553  NA 140 138  K 4.7 4.0  CL 106 107  CO2 24 25  GLUCOSE 94 98  BUN 6 9  CREATININE 0.72 0.69  CALCIUM 9.0 8.8*  MG  --  2.3  PHOS  --  4.4   GFR: Estimated Creatinine Clearance: 107.5 mL/min (by C-G formula based on SCr of 0.69 mg/dL). Liver Function Tests: Recent Labs  Lab 08/12/18 2002 08/13/18 0553  AST 16 15  ALT 18 16  ALKPHOS 73 74  BILITOT 0.5 0.8  PROT 7.1 6.3*  ALBUMIN 3.9 3.5   No results for input(s): LIPASE, AMYLASE in the last 168 hours. No results for input(s): AMMONIA in the last 168 hours. Coagulation Profile: Recent Labs  Lab 08/12/18 2002  INR 0.95   Cardiac Enzymes: Recent Labs  Lab 08/12/18 2234 08/13/18 0553 08/13/18 1016  TROPONINI <0.03 <0.03 <0.03   BNP (last 3 results) No results for input(s): PROBNP in the last 8760 hours. HbA1C: No results for input(s): HGBA1C in the last 72 hours. CBG: No results for input(s): GLUCAP in the last 168 hours. Lipid Profile: No results for input(s): CHOL, HDL, LDLCALC, TRIG, CHOLHDL, LDLDIRECT in the last 72 hours. Thyroid Function Tests: Recent Labs    08/13/18 0552  TSH 2.410   Anemia Panel: No results for input(s): VITAMINB12, FOLATE, FERRITIN, TIBC, IRON, RETICCTPCT in the last 72 hours. Sepsis Labs: No results for input(s): PROCALCITON, LATICACIDVEN in the last 168 hours.  No results found for this or any previous visit (from the past 240 hour(s)).   Radiology Studies: Ct Angio Chest Pe W And/or Wo Contrast  Result Date: 08/12/2018 CLINICAL DATA:  Chest pain, possible pulmonary emboli EXAM: CT ANGIOGRAPHY CHEST WITH CONTRAST TECHNIQUE: Multidetector CT imaging of the chest was performed using the standard protocol during bolus administration of  intravenous contrast. Multiplanar CT image reconstructions and MIPs were obtained to evaluate the vascular anatomy. CONTRAST:  ISOVUE-370 COMPARISON:  None. FINDINGS: Cardiovascular: Thoracic aorta demonstrates no aneurysmal dilatation or dissection. No cardiac enlargement is seen. No coronary calcifications are noted. Pulmonary artery is within normal limits with a normal branching pattern. No filling defect to suggest pulmonary embolism is identified. Mediastinum/Nodes: Thoracic inlet is within normal limits. No hilar or mediastinal adenopathy is noted. The esophagus is normal limits. Lungs/Pleura: Lungs are well aerated bilaterally. Calcified granuloma is noted in the right upper lobe. No effusion or focal infiltrate is seen. Upper Abdomen: Visualized upper abdomen is unremarkable. Musculoskeletal: Degenerative changes of the thoracic spine are noted. Review of the MIP images confirms the above findings. IMPRESSION: No evidence of pulmonary emboli. Changes consistent with prior granulomatous disease. No acute abnormality  noted. Electronically Signed   By: Alcide CleverMark  Lukens M.D.   On: 08/12/2018 21:42   Dg Foot Complete Left  Result Date: 08/12/2018 CLINICAL DATA:  27Fifty-four old male history of pain in the left foot with no injury. EXAM: LEFT FOOT - COMPLETE 3+ VIEW COMPARISON:  None. FINDINGS: There is no evidence of fracture or dislocation. There is no evidence of arthropathy or other focal bone abnormality. Soft tissues are unremarkable. IMPRESSION: Negative. Electronically Signed   By: Gilmer MorJaime  Wagner D.O.   On: 08/12/2018 19:06    Scheduled Meds: . folic acid  1 mg Oral Daily  . LORazepam  0-4 mg Intravenous Q6H   Or  . LORazepam  0-4 mg Oral Q6H  . [START ON 08/15/2018] LORazepam  0-4 mg Intravenous Q12H   Or  . [START ON 08/15/2018] LORazepam  0-4 mg Oral Q12H  . multivitamin with minerals  1 tablet Oral Daily  . nicotine  21 mg Transdermal Daily  . pantoprazole  40 mg Oral BID  . sucralfate   1 g Oral TID WC & HS  . thiamine  100 mg Oral Daily   Or  . thiamine  100 mg Intravenous Daily   Continuous Infusions: . heparin 1,400 Units/hr (08/13/18 0200)     LOS: 0 days   Rickey BarbaraStephen Tyan Lasure, MD [facility] Pager On Amion  If 7PM-7AM, please contact night-coverage 08/13/2018, 1:53 PM

## 2018-08-14 DIAGNOSIS — F10939 Alcohol use, unspecified with withdrawal, unspecified: Secondary | ICD-10-CM | POA: Diagnosis present

## 2018-08-14 DIAGNOSIS — F10230 Alcohol dependence with withdrawal, uncomplicated: Secondary | ICD-10-CM

## 2018-08-14 DIAGNOSIS — F10239 Alcohol dependence with withdrawal, unspecified: Secondary | ICD-10-CM

## 2018-08-14 DIAGNOSIS — I82452 Acute embolism and thrombosis of left peroneal vein: Principal | ICD-10-CM

## 2018-08-14 DIAGNOSIS — R079 Chest pain, unspecified: Secondary | ICD-10-CM | POA: Diagnosis not present

## 2018-08-14 HISTORY — DX: Alcohol use, unspecified with withdrawal, unspecified: F10.939

## 2018-08-14 HISTORY — DX: Alcohol dependence with withdrawal, unspecified: F10.239

## 2018-08-14 LAB — COMPREHENSIVE METABOLIC PANEL
ALBUMIN: 3.4 g/dL — AB (ref 3.5–5.0)
ALT: 15 U/L (ref 0–44)
AST: 15 U/L (ref 15–41)
Alkaline Phosphatase: 66 U/L (ref 38–126)
Anion gap: 9 (ref 5–15)
BUN: 8 mg/dL (ref 6–20)
CO2: 24 mmol/L (ref 22–32)
Calcium: 8.8 mg/dL — ABNORMAL LOW (ref 8.9–10.3)
Chloride: 105 mmol/L (ref 98–111)
Creatinine, Ser: 0.69 mg/dL (ref 0.61–1.24)
GFR calc Af Amer: >60 mL/min (ref >60)
GFR calc non Af Amer: >60 mL/min (ref >60)
Glucose, Bld: 101 mg/dL — ABNORMAL HIGH (ref 70–99)
POTASSIUM: 3.9 mmol/L (ref 3.5–5.1)
Sodium: 138 mmol/L (ref 135–145)
Total Bilirubin: 0.8 mg/dL (ref 0.3–1.2)
Total Protein: 6.6 g/dL (ref 6.5–8.1)

## 2018-08-14 LAB — CBC
HCT: 33.8 % — ABNORMAL LOW (ref 39.0–52.0)
Hemoglobin: 10.9 g/dL — ABNORMAL LOW (ref 13.0–17.0)
MCH: 31.8 pg (ref 26.0–34.0)
MCHC: 32.2 g/dL (ref 30.0–36.0)
MCV: 98.5 fL (ref 80.0–100.0)
NRBC: 0 % (ref 0.0–0.2)
PLATELETS: 448 10*3/uL — AB (ref 150–400)
RBC: 3.43 MIL/uL — ABNORMAL LOW (ref 4.22–5.81)
RDW: 14 % (ref 11.5–15.5)
WBC: 5.8 10*3/uL (ref 4.0–10.5)

## 2018-08-14 LAB — HEPARIN LEVEL (UNFRACTIONATED): Heparin Unfractionated: 0.67 IU/mL (ref 0.30–0.70)

## 2018-08-14 MED ORDER — HEPARIN (PORCINE) 25000 UT/250ML-% IV SOLN: 1350.0000 [IU]/h | INTRAVENOUS | Status: AC

## 2018-08-14 MED ORDER — RIVAROXABAN 15 MG PO TABS
15.0000 mg | ORAL_TABLET | Freq: Two times a day (BID) | ORAL | Status: DC
  Administered 2018-08-14 – 2018-08-15 (×3): 15 mg via ORAL
  Filled 2018-08-14 (×3): qty 1

## 2018-08-14 MED ORDER — RIVAROXABAN 20 MG PO TABS: 20.0000 mg | ORAL_TABLET | Freq: Every day | ORAL | Status: DC

## 2018-08-14 NOTE — Evaluation (Signed)
Physical Therapy Evaluation Patient Details Name: Randall Simpson MRN: [id number] DOB: 1966/02/18 Today's Date: 08/14/2018   History of Present Illness  53 y.o.malewith medical history significant of Hematemesis secondary to severe grade D erosive esophagitis, COPD, ?MI in 06/19/2018, current alcohol abuse, tobacco abuse and admitted for alcohol detox and found to have acute DVT  Clinical Impression  Pt admitted with above diagnosis. Pt currently with functional limitations due to the deficits listed below (see PT Problem List).  Pt will benefit from skilled PT to increase their independence and safety with mobility to allow discharge to the venue listed below.  Pt presents with antalgic gait due to L foot pain however gait improved with use of RW.  Recommend RW upon d/c.     Follow Up Recommendations No PT follow up    Equipment Recommendations  Rolling walker with 5" wheels    Recommendations for Other Services       Precautions / Restrictions Precautions Precautions: Fall      Mobility  Bed Mobility Overal bed mobility: Needs Assistance Bed Mobility: Supine to Sit;Sit to Supine     Supine to sit: Supervision Sit to supine: Supervision      Transfers Overall transfer level: Needs assistance Equipment used: None Transfers: Sit to/from Stand Sit to Stand: Min guard         General transfer comment: min/guard for safety - fall mat in place and initially did not use RW  Ambulation/Gait Ambulation/Gait assistance: Min guard Gait Distance (Feet): 200 Feet Assistive device: Rolling walker (2 wheeled) Gait Pattern/deviations: Step-to pattern;Decreased stance time - left;Decreased step length - right;Antalgic     General Gait Details: very antalgic gait observed due to L foot pain and pt attempting to hold hand rail, pt declined SPC however was agreeable to RW, steadiness and antalgic gait improved with use of RW  Stairs            Wheelchair Mobility     Modified Rankin (Stroke Patients Only)       Balance Overall balance assessment: Needs assistance         Standing balance support: No upper extremity supported Standing balance-Leahy Scale: Fair                               Pertinent Vitals/Pain Pain Assessment: 0-10 Pain Score: 4  Pain Location: L foot Pain Descriptors / Indicators: Sore Pain Intervention(s): Monitored during session;Limited activity within patient's tolerance;Repositioned    Home Living Family/patient expects to be discharged to:: Other (Comment)                 Additional Comments: pt reports discharging someplace with someone - vague on details but reports he may have steps and will have assist available    Prior Function Level of Independence: Independent               Hand Dominance        Extremity/Trunk Assessment        Lower Extremity Assessment Lower Extremity Assessment: LLE deficits/detail LLE Deficits / Details: reports L foot pain, was initially told he had plantar fasciitis and then found DVT, moving LE withing functional limits       Communication   Communication: No difficulties  Cognition Arousal/Alertness: Awake/alert Behavior During Therapy: WFL for tasks assessed/performed Overall Cognitive Status: Within Functional Limits for tasks assessed  General Comments      Exercises     Assessment/Plan    PT Assessment Patient needs continued PT services  PT Problem List Decreased mobility;Decreased balance;Decreased knowledge of use of DME;Pain;Decreased safety awareness       PT Treatment Interventions Functional mobility training;DME instruction;Gait training;Neuromuscular re-education;Therapeutic activities;Stair training;Therapeutic exercise;Balance training;Patient/family education    PT Goals (Current goals can be found in the Care Plan section)  Acute Rehab PT Goals PT Goal  Formulation: With patient Time For Goal Achievement: 08/21/18 Potential to Achieve Goals: Good    Frequency Min 3X/week   Barriers to discharge        Co-evaluation               AM-PAC PT "6 Clicks" Mobility  Outcome Measure Help needed turning from your back to your side while in a flat bed without using bedrails?: None Help needed moving from lying on your back to sitting on the side of a flat bed without using bedrails?: None Help needed moving to and from a bed to a chair (including a wheelchair)?: None Help needed standing up from a chair using your arms (e.g., wheelchair or bedside chair)?: A Little Help needed to walk in hospital room?: A Little Help needed climbing 3-5 steps with a railing? : A Little 6 Click Score: 21    End of Session Equipment Utilized During Treatment: Gait belt Activity Tolerance: Patient tolerated treatment well Patient left: in bed;with call bell/phone within reach;with bed alarm set Nurse Communication: Mobility status PT Visit Diagnosis: Difficulty in walking, not elsewhere classified (R26.2)    Time: 1446-1500 PT Time Calculation (min) (ACUTE ONLY): 14 min   Charges:   PT Evaluation $PT Eval Low Complexity: 1 Low         Zenovia Jarred, PT, DPT Acute Rehabilitation Services Office: [phone] Pager: [phone]  [name] 08/14/2018, 3:17 PM

## 2018-08-14 NOTE — Discharge Instructions (Signed)
Information on my medicine - XARELTO (rivaroxaban)  This medication education was reviewed with me or my healthcare representative as part of my discharge preparation.  The pharmacist that spoke with me during my hospital stay was:    WHY WAS XARELTO PRESCRIBED FOR YOU? Xarelto was prescribed to treat blood clots that may have been found in the veins of your legs (deep vein thrombosis) or in your lungs (pulmonary embolism) and to reduce the risk of them occurring again.  What do you need to know about Xarelto? The starting dose is one 15 mg tablet taken TWICE daily with food for the FIRST 21 DAYS then on March 6th, 2020  the dose is changed to one 20 mg tablet taken ONCE A DAY with your evening meal.  DO NOT stop taking Xarelto without talking to the health care provider who prescribed the medication.  Refill your prescription for 20 mg tablets before you run out.  After discharge, you should have regular check-up appointments with your healthcare provider that is prescribing your Xarelto.  In the future your dose may need to be changed if your kidney function changes by a significant amount.  What do you do if you miss a dose? If you are taking Xarelto TWICE DAILY and you miss a dose, take it as soon as you remember. You may take two 15 mg tablets (total 30 mg) at the same time then resume your regularly scheduled 15 mg twice daily the next day.  If you are taking Xarelto ONCE DAILY and you miss a dose, take it as soon as you remember on the same day then continue your regularly scheduled once daily regimen the next day. Do not take two doses of Xarelto at the same time.   Important Safety Information Xarelto is a blood thinner medicine that can cause bleeding. You should call your healthcare provider right away if you experience any of the following: ? Bleeding from an injury or your nose that does not stop. ? Unusual colored urine (red or dark brown) or unusual colored stools (red  or black). ? Unusual bruising for unknown reasons. ? A serious fall or if you hit your head (even if there is no bleeding).  Some medicines may interact with Xarelto and might increase your risk of bleeding while on Xarelto. To help avoid this, consult your healthcare provider or pharmacist prior to using any new prescription or non-prescription medications, including herbals, vitamins, non-steroidal anti-inflammatory drugs (NSAIDs) and supplements.  This website has more information on Xarelto: VisitDestination.com.br.

## 2018-08-14 NOTE — Progress Notes (Signed)
PROGRESS NOTE    Randall Simpson  XIP:[id number] DOB: 07/16/1965 DOA: 08/12/2018 PCP: Patient, No Pcp Per    Brief Narrative:  53 y.o. male with medical history significant of Hematemesis secondary to severe grade D erosive esophagitis COPD, ?MI in 06/19/2018, current alcohol abuse, tobacco abuse Atrial fibrillation    Presented with   rest for alcohol detox last alcoholic drink was today.  Also reported 2-day history of left foot pain.  Had an ultrasound done Doppler showed acute DVT Been treating his pain with alcohol.  Since he has not drank anything since this morning he started to have some shakes and anxiety paresthesias nausea and headache.  No hallucinations. Recently admitted at [facility] for hematemesis and ETOH withdrawal was admitted form 3 to 7th of February   he was diagnosed with hematemesis secondary to erosive esophagitis.  Started on Protonix twice a day    Reports last week had a possible seizure he woke up in an ambulance not sure how he got there.  Reports no bleeding since last week  No recent falls since prior discharge. Had neg head CT last week    Reports have quit drinking in the past and stayed sober for months Reports  Chest pain worse with exertion has been going on for a while  Reports MI last year  Assessment & Plan:   Principal Problem:   DVT (deep venous thrombosis) (HCC) Active Problems:   Chest pain   Alcohol abuse   Tobacco abuse   History of hematemesis   Alcohol withdrawal (HCC)   . DVT (deep venous thrombosis) (HCC) - DVT in the setting of recent hospitalization.   -Noted to have recent GI bleed secondary to erosive esophagitis -Presently remains on heparin gtt, tolerating thus far. No signs of acute bleed -Will transition to PO xarelto  . Chest pain chronic patient states he has had recent MI.   - Serial troponin neg x 3 - 2d echo performed, reviewed. Unremarkable findings  . Alcohol abuse - -Presented with  CIWA inexcess of 16. -This AM, CIWA noted to be 16, shaking on exam -Continue CIWA protocol -Cessation done at bedside. Patient states that his recent admissions have encouraged him to quit his ETOH habits and that he is keen on quitting further alcohol abuse -Discussed with Case manager regarding providing information about ETOH resource programs  . Tobacco abuse -   - Tobacco cessation done at time of admission - Stable at present  DVT prophylaxis: Xarelto Code Status: Full Family Communication: Pt in room, family not at bedside Disposition Plan: Uncertain at this time  Consultants:     Procedures:     Antimicrobials: Anti-infectives (From admission, onward)   None      Subjective: Complaining of shaking this AM  Objective: Vitals:   08/13/18 1325 08/13/18 1857 08/13/18 2337 08/14/18 0629  BP: 100/69 104/65 111/68 108/72  Pulse: 82 87 74 85  Resp: 20 20 18 16   Temp: 98.8 F (37.1 C) 99.3 F (37.4 C) 98 F (36.7 C) 98.4 F (36.9 C)  TempSrc: Oral Oral Oral Oral  SpO2: 97% 93% 96% 94%  Weight:      Height:        Intake/Output Summary (Last 24 hours) at 08/14/2018 1427 Last data filed at 08/14/2018 0600 Gross per 24 hour  Intake 401.68 ml  Output 650 ml  Net -248.32 ml   Filed Weights   08/12/18 1753 08/12/18 2348  Weight: 74.8 kg 72 kg  Examination: General exam: Awake, laying in bed, in nad Respiratory system: Normal respiratory effort, no wheezing Cardiovascular system: regular rate, s1, s2 Gastrointestinal system: Soft, nondistended, positive BS Central nervous system: CN2-12 grossly intact, strength intact Extremities: Perfused, no clubbing Skin: Normal skin turgor, no notable skin lesions seen Psychiatry: Mood normal // no visual hallucinations   Data Reviewed: I have personally reviewed following labs and imaging studies  CBC: Recent Labs  Lab 08/12/18 2002 08/13/18 0553 08/14/18 0538  WBC 7.6 5.5 5.8  NEUTROABS 4.4  --   --     HGB 10.5* 9.9* 10.9*  HCT 32.8* 31.4* 33.8*  MCV 98.2 96.3 98.5  PLT 478* 471* 448*   Basic Metabolic Panel: Recent Labs  Lab 08/12/18 2002 08/13/18 0553 08/14/18 0538  NA 140 138 138  K 4.7 4.0 3.9  CL 106 107 105  CO2 24 25 24   GLUCOSE 94 98 101*  BUN 6 9 8   CREATININE 0.72 0.69 0.69  CALCIUM 9.0 8.8* 8.8*  MG  --  2.3  --   PHOS  --  4.4  --    GFR: Estimated Creatinine Clearance: 107.5 mL/min (by C-G formula based on SCr of 0.69 mg/dL). Liver Function Tests: Recent Labs  Lab 08/12/18 2002 08/13/18 0553 08/14/18 0538  AST 16 15 15   ALT 18 16 15   ALKPHOS 73 74 66  BILITOT 0.5 0.8 0.8  PROT 7.1 6.3* 6.6  ALBUMIN 3.9 3.5 3.4*   No results for input(s): LIPASE, AMYLASE in the last 168 hours. No results for input(s): AMMONIA in the last 168 hours. Coagulation Profile: Recent Labs  Lab 08/12/18 2002  INR 0.95   Cardiac Enzymes: Recent Labs  Lab 08/12/18 2234 08/13/18 0553 08/13/18 1016  TROPONINI <0.03 <0.03 <0.03   BNP (last 3 results) No results for input(s): PROBNP in the last 8760 hours. HbA1C: No results for input(s): HGBA1C in the last 72 hours. CBG: No results for input(s): GLUCAP in the last 168 hours. Lipid Profile: No results for input(s): CHOL, HDL, LDLCALC, TRIG, CHOLHDL, LDLDIRECT in the last 72 hours. Thyroid Function Tests: Recent Labs    08/13/18 0552  TSH 2.410   Anemia Panel: No results for input(s): VITAMINB12, FOLATE, FERRITIN, TIBC, IRON, RETICCTPCT in the last 72 hours. Sepsis Labs: No results for input(s): PROCALCITON, LATICACIDVEN in the last 168 hours.  No results found for this or any previous visit (from the past 240 hour(s)).   Radiology Studies: Ct Angio Chest Pe W And/or Wo Contrast  Result Date: 08/12/2018 CLINICAL DATA:  Chest pain, possible pulmonary emboli EXAM: CT ANGIOGRAPHY CHEST WITH CONTRAST TECHNIQUE: Multidetector CT imaging of the chest was performed using the standard protocol during bolus  administration of intravenous contrast. Multiplanar CT image reconstructions and MIPs were obtained to evaluate the vascular anatomy. CONTRAST:  ISOVUE-370 COMPARISON:  None. FINDINGS: Cardiovascular: Thoracic aorta demonstrates no aneurysmal dilatation or dissection. No cardiac enlargement is seen. No coronary calcifications are noted. Pulmonary artery is within normal limits with a normal branching pattern. No filling defect to suggest pulmonary embolism is identified. Mediastinum/Nodes: Thoracic inlet is within normal limits. No hilar or mediastinal adenopathy is noted. The esophagus is normal limits. Lungs/Pleura: Lungs are well aerated bilaterally. Calcified granuloma is noted in the right upper lobe. No effusion or focal infiltrate is seen. Upper Abdomen: Visualized upper abdomen is unremarkable. Musculoskeletal: Degenerative changes of the thoracic spine are noted. Review of the MIP images confirms the above findings. IMPRESSION: No evidence of pulmonary emboli.  Changes consistent with prior granulomatous disease. No acute abnormality noted. Electronically Signed   By: Alcide Clever M.D.   On: 08/12/2018 21:42   Dg Foot Complete Left  Result Date: 08/12/2018 CLINICAL DATA:  26 old male history of pain in the left foot with no injury. EXAM: LEFT FOOT - COMPLETE 3+ VIEW COMPARISON:  None. FINDINGS: There is no evidence of fracture or dislocation. There is no evidence of arthropathy or other focal bone abnormality. Soft tissues are unremarkable. IMPRESSION: Negative. Electronically Signed   By: Gilmer Mor D.O.   On: 08/12/2018 19:06    Scheduled Meds: . folic acid  1 mg Oral Daily  . LORazepam  0-4 mg Intravenous Q6H   Or  . LORazepam  0-4 mg Oral Q6H  . [START ON 08/15/2018] LORazepam  0-4 mg Intravenous Q12H   Or  . [START ON 08/15/2018] LORazepam  0-4 mg Oral Q12H  . multivitamin with minerals  1 tablet Oral Daily  . nicotine  21 mg Transdermal Daily  . pantoprazole  40 mg Oral  BID  . rivaroxaban  15 mg Oral BID WC   Followed by  . [START ON 09/04/2018] rivaroxaban  20 mg Oral Q supper  . sucralfate  1 g Oral TID WC & HS  . thiamine  100 mg Oral Daily   Or  . thiamine  100 mg Intravenous Daily   Continuous Infusions:    LOS: 0 days   Rickey Barbara, MD [facility] Pager On Amion  If 7PM-7AM, please contact night-coverage 08/14/2018, 2:27 PM

## 2018-08-14 NOTE — Progress Notes (Signed)
ANTICOAGULATION CONSULT NOTE   Pharmacy Consult for heparin --> rivaroxaban Indication: acute DVT  No Known Allergies  Patient Measurements: Height: 5\' 10"  (177.8 cm) Weight: 158 lb 11.7 oz (72 kg) IBW/kg (Calculated) : 73 Heparin Dosing Weight: 72 kg  Vital Signs: Temp: 98.4 F (36.9 C) (02/14 0629) Temp Source: Oral (02/14 0629) BP: 108/72 (02/14 0629) Pulse Rate: 85 (02/14 0629)  Labs: Recent Labs    08/12/18 2002 08/12/18 2234 08/13/18 0553 08/13/18 0810 08/13/18 1016 08/13/18 1344 08/14/18 0538  HGB 10.5*  --  9.9*  --   --   --  10.9*  HCT 32.8*  --  31.4*  --   --   --  33.8*  PLT 478*  --  471*  --   --   --  448*  LABPROT 12.5  --   --   --   --   --   --   INR 0.95  --   --   --   --   --   --   HEPARINUNFRC  --   --   --  0.58  --  0.49 0.67  CREATININE 0.72  --  0.69  --   --   --  0.69  TROPONINI  --  <0.03 <0.03  --  <0.03  --   --     Estimated Creatinine Clearance: 107.5 mL/min (by C-G formula based on SCr of 0.69 mg/dL).   Medications:  No anticoagulants PTA  Assessment: Patient is a 53 y.o recently hospitalized at [facility] a week ago for alcohol withdrawal and treated for hematemesis secondary to erosive esophagitis. He presented to the ED on 2/12 for alcohol detox and with c/o left foot pain. LE doppler at [facility] was positive for acute DVT. Heparin drip started on admission for DVT. Pharmacy consulted this morning to convert from heparin drip to rivaroxaban.   -2/12:  chest CTA neg for PE  Today, 08/14/2018:  Hgb 10.9 - increased  Plt - stable  Wt = 72 kg, SCr = 0.7 - WNL.   HL = 0.67 therapeutic this morning, but on upper end of therapeutic range on heparin infusion of 1400 units/hr. Rate reduced to 1350 units/hr.   Goal of Therapy:  Monitor platelets by anticoagulation protocol: Yes   Plan:   Discontinue heparin drip and initiate rivaroxaban at time of heparin drip discontinuation this morning  Rivaroxaban  15 mg PO BID with meals x 21 days followed by 20 mg PO daily with supper.  Monitor renal function  Monitor for signs/symptoms of bleeding  Manufacturer coupon and medication counseling provided by pharmacy on 2/14  Cindi Carbon, PharmD 08/14/18

## 2018-08-14 NOTE — Progress Notes (Signed)
Clinical Social Worker following patient for support and discharge needs. Patient stated he lives in [place] and was on his way back to [place] from [place] and became sick. Patient stated he was sober from may 2019 to January 2020 but relapse when he went to [place] to visit some friends during the holiday. Patient stated he is aware that he needs to be sober again to be able to take care of his health. Patient stated he has connected himself with a program in the area called "[facility] Patient stated he will discharge to one of [facility] house after discharge and receive help for his substance abuse. Patient stated that Annette Stable will pick him up from the hospital once medically stable. CSW signing off as patient has safe discharge plan and has set up follow up for substance abuse.   Marrianne Mood, MSW,  Theresia Majors [phone]

## 2018-08-15 DIAGNOSIS — I82452 Acute embolism and thrombosis of left peroneal vein: Secondary | ICD-10-CM | POA: Diagnosis not present

## 2018-08-15 DIAGNOSIS — F10230 Alcohol dependence with withdrawal, uncomplicated: Secondary | ICD-10-CM | POA: Diagnosis not present

## 2018-08-15 LAB — CBC
HCT: 34.7 % — ABNORMAL LOW (ref 39.0–52.0)
Hemoglobin: 10.9 g/dL — ABNORMAL LOW (ref 13.0–17.0)
MCH: 30.6 pg (ref 26.0–34.0)
MCHC: 31.4 g/dL (ref 30.0–36.0)
MCV: 97.5 fL (ref 80.0–100.0)
Platelets: 465 10*3/uL — ABNORMAL HIGH (ref 150–400)
RBC: 3.56 MIL/uL — ABNORMAL LOW (ref 4.22–5.81)
RDW: 13.7 % (ref 11.5–15.5)
WBC: 5.9 10*3/uL (ref 4.0–10.5)
nRBC: 0 % (ref 0.0–0.2)

## 2018-08-15 LAB — COMPREHENSIVE METABOLIC PANEL
ALT: 16 U/L (ref 0–44)
AST: 18 U/L (ref 15–41)
Albumin: 3.6 g/dL (ref 3.5–5.0)
Alkaline Phosphatase: 75 U/L (ref 38–126)
Anion gap: 6 (ref 5–15)
BUN: 12 mg/dL (ref 6–20)
CO2: 26 mmol/L (ref 22–32)
Calcium: 8.9 mg/dL (ref 8.9–10.3)
Chloride: 106 mmol/L (ref 98–111)
Creatinine, Ser: 0.61 mg/dL (ref 0.61–1.24)
GFR calc Af Amer: >60 mL/min (ref >60)
GFR calc non Af Amer: >60 mL/min (ref >60)
Glucose, Bld: 95 mg/dL (ref 70–99)
Potassium: 4.1 mmol/L (ref 3.5–5.1)
Sodium: 138 mmol/L (ref 135–145)
Total Bilirubin: 0.6 mg/dL (ref 0.3–1.2)
Total Protein: 6.6 g/dL (ref 6.5–8.1)

## 2018-08-15 MED ORDER — RIVAROXABAN 15 MG PO TABS: 15.0000 mg | ORAL_TABLET | Freq: Two times a day (BID) | ORAL | 0 refills | Status: DC

## 2018-08-15 MED ORDER — RIVAROXABAN (XARELTO) VTE STARTER PACK (15 & 20 MG): ORAL_TABLET | ORAL | 0 refills | Status: DC

## 2018-08-15 MED ORDER — HYDROXYZINE HCL 10 MG PO TABS: 10.0000 mg | ORAL_TABLET | Freq: Three times a day (TID) | ORAL | 0 refills | Status: DC | PRN

## 2018-08-15 MED ORDER — HYDROXYZINE HCL 10 MG PO TABS
10.0000 mg | ORAL_TABLET | Freq: Three times a day (TID) | ORAL | Status: DC | PRN
  Administered 2018-08-15: 10 mg via ORAL
  Filled 2018-08-15 (×2): qty 1

## 2018-08-15 MED ORDER — HYDROXYZINE HCL 10 MG PO TABS
10.0000 mg | ORAL_TABLET | Freq: Three times a day (TID) | ORAL | Status: DC | PRN
  Filled 2018-08-15: qty 1

## 2018-08-15 MED ORDER — RIVAROXABAN 20 MG PO TABS: 20.0000 mg | ORAL_TABLET | Freq: Every day | ORAL | 0 refills | Status: DC

## 2018-08-15 NOTE — Care Management Note (Signed)
Case Management Note  Patient Details  Name: Randall Simpson MRN: [id number] Date of Birth: [birth year]  Subjective/Objective:   DVT, chest pain                 Action/Plan: NCM spoke to pt and provided brochure for [facility] to call on Monday to schedule appt. States he is currently not working his job and not sure if his insurance is still active. Provided pt with an application for Xarelto to take with him to his appt. He has Xarelot 30 day free trial card and $10 copay card.   Expected Discharge Date:  08/15/18               Expected Discharge Plan:  Home/Self Care  In-House Referral:  NA  Discharge planning Services  CM Consult, Medication Assistance  Post Acute Care Choice:  NA Choice offered to:  NA  DME Arranged:  N/A DME Agency:  NA  HH Arranged:  NA HH Agency:  NA  Status of Service:  Completed, signed off  If discussed at Long Length of Stay Meetings, dates discussed:    Additional Comments:  Elliot Cousin, RN 08/15/2018, 2:48 PM

## 2018-08-15 NOTE — Discharge Summary (Signed)
Physician Discharge Summary  Randall Simpson YJW:[id number] DOB: [birth year] DOA: 08/12/2018  PCP: Patient, No Pcp Per  Admit date: 08/12/2018 Discharge date: 08/15/2018  Admitted From: Home Disposition:  Home  Recommendations for Outpatient Follow-up:  1. Follow up with PCP in 1-2 weeks 2. Continue patient to abstain from alcohol  Discharge Condition:Improved CODE STATUS:Full Diet recommendation: Regular   Brief/Interim Summary: 53 y.o.malewith medical history significant of Hematemesis secondary to severe grade D erosive esophagitis COPD, ?MI in 06/19/2018, current alcohol abuse, tobacco abuse Atrial fibrillation   [facility]ed withrest for alcohol detox last alcoholic drink was today. Also reported 2-day history of left foot pain. Had an ultrasound done Doppler showed acute DVT Been treating his pain with alcohol. Since he has not drank anything since this morning he started to have some shakes and anxiety paresthesias nausea and headache. No hallucinations. [facility] was admitted form 3 to 7th of February  hewas diagnosed withhematemesis secondary to erosive esophagitis. Started on Protonix twice a day    Reports last week had a possible seizure he woke up in an ambulance not sure how he got there.  Reports no bleeding since last week  No recent falls since prior discharge. Had neg head CT last week    Reports have quit drinking in the past and stayed sober for months Reports Chest pain worse with exertion has been going on for a while  Reports MI last year  Discharge Diagnoses:  Principal Problem:   DVT (deep venous thrombosis) (HCC) Active Problems:   Chest pain   Alcohol abuse   Tobacco abuse   History of hematemesis   Alcohol withdrawal (HCC)   . DVT (deep venous thrombosis) (HCC) - DVT in the setting of recent hospitalization.  -Noted to have recent GI bleed secondary to  erosive esophagitis -Had been continued on heparin gtt, tolerating thus far. No signs of acute bleed this admission -Transitioned to PO xarelto with 30 day voucher  . Chest painchronic patient states he has had recent MI.  - Serial troponin neg x 3 - 2d echo performed, reviewed. Unremarkable findings  . Alcohol abuse- -Presented with CIWA inexcess of 16. -Continued CIWA protocol -Cessation done at bedside. Patient states that his recent admissions have encouraged him to quit his ETOH habits and that he is keen on quitting further alcohol abuse -Discussed with Case manager regarding providing information about ETOH resource programs  . Tobacco abuse- - Tobacco cessation done at time of admission - Stable at present   Discharge Instructions   Allergies as of 08/15/2018   No Known Allergies     Medication List    TAKE these medications   acetaminophen 500 MG tablet Commonly known as:  TYLENOL Take 1,000 mg by mouth every 6 (six) hours as needed for mild pain, moderate pain or headache.   hydrOXYzine 10 MG tablet Commonly known as:  ATARAX/VISTARIL Take 1 tablet (10 mg total) by mouth 3 (three) times daily as needed for anxiety (use 2nd).   pantoprazole 40 MG tablet Commonly known as:  PROTONIX Take 40 mg by mouth daily.   Rivaroxaban 15 MG Tabs tablet Commonly known as:  XARELTO Take 1 tablet (15 mg total) by mouth 2 (two) times daily with a meal for 19 days.   rivaroxaban 20 MG Tabs tablet Commonly known as:  XARELTO Take 1 tablet (20 mg total) by mouth daily with supper for 30 days. Start taking on:  September 04, 2018  Follow-up Information    Follow up with PCP in 1-2 weeks. Schedule an appointment as soon as possible for a visit.          No Known Allergies  Procedures/Studies: Ct Angio Chest Pe W And/or Wo Contrast  Result Date: 08/12/2018 CLINICAL DATA:  Chest pain, possible pulmonary emboli EXAM: CT ANGIOGRAPHY CHEST WITH CONTRAST TECHNIQUE:  Multidetector CT imaging of the chest was performed using the standard protocol during bolus administration of intravenous contrast. Multiplanar CT image reconstructions and MIPs were obtained to evaluate the vascular anatomy. CONTRAST:  ISOVUE-370 COMPARISON:  None. FINDINGS: Cardiovascular: Thoracic aorta demonstrates no aneurysmal dilatation or dissection. No cardiac enlargement is seen. No coronary calcifications are noted. Pulmonary artery is within normal limits with a normal branching pattern. No filling defect to suggest pulmonary embolism is identified. Mediastinum/Nodes: Thoracic inlet is within normal limits. No hilar or mediastinal adenopathy is noted. The esophagus is normal limits. Lungs/Pleura: Lungs are well aerated bilaterally. Calcified granuloma is noted in the right upper lobe. No effusion or focal infiltrate is seen. Upper Abdomen: Visualized upper abdomen is unremarkable. Musculoskeletal: Degenerative changes of the thoracic spine are noted. Review of the MIP images confirms the above findings. IMPRESSION: No evidence of pulmonary emboli. Changes consistent with prior granulomatous disease. No acute abnormality noted. Electronically Signed   By: Alcide Clever M.D.   On: 08/12/2018 21:42   Dg Foot Complete Left  Result Date: 08/12/2018 CLINICAL DATA:  5 old male history of pain in the left foot with no injury. EXAM: LEFT FOOT - COMPLETE 3+ VIEW COMPARISON:  None. FINDINGS: There is no evidence of fracture or dislocation. There is no evidence of arthropathy or other focal bone abnormality. Soft tissues are unremarkable. IMPRESSION: Negative. Electronically Signed   By: Gilmer Mor D.O.   On: 08/12/2018 19:06     Subjective: Eager to go home  Discharge Exam: Vitals:   08/15/18 0659 08/15/18 1353  BP: 94/66 104/71  Pulse: 79 91  Resp: 16 20  Temp: 97.9 F (36.6 C) 99.5 F (37.5 C)  SpO2: 94% 98%   Vitals:   08/14/18 1436 08/15/18 0030 08/15/18 0659 08/15/18  1353  BP: 93/60 (!) 88/60 94/66 104/71  Pulse: 89 78 79 91  Resp: 14 16 16 20   Temp: 98.3 F (36.8 C) 97.8 F (36.6 C) 97.9 F (36.6 C) 99.5 F (37.5 C)  TempSrc: Oral Oral Oral Oral  SpO2: 97% 92% 94% 98%  Weight:      Height:        General: Pt is alert, awake, not in acute distress Cardiovascular: RRR, S1/S2 +, no rubs, no gallops Respiratory: CTA bilaterally, no wheezing, no rhonchi Abdominal: Soft, NT, ND, bowel sounds + Extremities: no edema, no cyanosis   The results of significant diagnostics from this hospitalization (including imaging, microbiology, ancillary and laboratory) are listed below for reference.     Microbiology: No results found for this or any previous visit (from the past 240 hour(s)).   Labs: BNP (last 3 results) Recent Labs    08/12/18 2002  BNP 60.4   Basic Metabolic Panel: Recent Labs  Lab 08/12/18 2002 08/13/18 0553 08/14/18 0538 08/15/18 0529  NA 140 138 138 138  K 4.7 4.0 3.9 4.1  CL 106 107 105 106  CO2 24 25 24 26   GLUCOSE 94 98 101* 95  BUN 6 9 8 12   CREATININE 0.72 0.69 0.69 0.61  CALCIUM 9.0 8.8* 8.8* 8.9  MG  --  2.3  --   --   PHOS  --  4.4  --   --    Liver Function Tests: Recent Labs  Lab 08/12/18 2002 08/13/18 0553 08/14/18 0538 08/15/18 0529  AST 16 15 15 18   ALT 18 16 15 16   ALKPHOS 73 74 66 75  BILITOT 0.5 0.8 0.8 0.6  PROT 7.1 6.3* 6.6 6.6  ALBUMIN 3.9 3.5 3.4* 3.6   No results for input(s): LIPASE, AMYLASE in the last 168 hours. No results for input(s): AMMONIA in the last 168 hours. CBC: Recent Labs  Lab 08/12/18 2002 08/13/18 0553 08/14/18 0538 08/15/18 0529  WBC 7.6 5.5 5.8 5.9  NEUTROABS 4.4  --   --   --   HGB 10.5* 9.9* 10.9* 10.9*  HCT 32.8* 31.4* 33.8* 34.7*  MCV 98.2 96.3 98.5 97.5  PLT 478* 471* 448* 465*   Cardiac Enzymes: Recent Labs  Lab 08/12/18 2234 08/13/18 0553 08/13/18 1016  TROPONINI <0.03 <0.03 <0.03   BNP: Invalid input(s): POCBNP CBG: No results for  input(s): GLUCAP in the last 168 hours. D-Dimer No results for input(s): DDIMER in the last 72 hours. Hgb A1c No results for input(s): HGBA1C in the last 72 hours. Lipid Profile No results for input(s): CHOL, HDL, LDLCALC, TRIG, CHOLHDL, LDLDIRECT in the last 72 hours. Thyroid function studies Recent Labs    08/13/18 0552  TSH 2.410   Anemia work up No results for input(s): VITAMINB12, FOLATE, FERRITIN, TIBC, IRON, RETICCTPCT in the last 72 hours. Urinalysis No results found for: COLORURINE, APPEARANCEUR, LABSPEC, PHURINE, GLUCOSEU, HGBUR, BILIRUBINUR, KETONESUR, PROTEINUR, UROBILINOGEN, NITRITE, LEUKOCYTESUR Sepsis Labs Invalid input(s): PROCALCITONIN,  WBC,  LACTICIDVEN Microbiology No results found for this or any previous visit (from the past 240 hour(s)).  Time spent:  SIGNED:   Rickey Barbara, MD  [facility] 08/15/2018, 2:11 PM  If 7PM-7AM, please contact night-coverage \

## 2018-08-19 ENCOUNTER — Emergency Department (HOSPITAL_COMMUNITY): Payer: BLUE CROSS/BLUE SHIELD

## 2018-08-19 ENCOUNTER — Emergency Department (HOSPITAL_COMMUNITY)
Admission: EM | Admit: 2018-08-19 | Discharge: 2018-08-19 | Disposition: A | Discharge status: Home / Self Care | Payer: BLUE CROSS/BLUE SHIELD | Attending: Emergency Medicine | Admitting: Emergency Medicine

## 2018-08-19 ENCOUNTER — Other Ambulatory Visit: Payer: Self-pay

## 2018-08-19 ENCOUNTER — Encounter (HOSPITAL_COMMUNITY): Payer: Self-pay | Admitting: Emergency Medicine

## 2018-08-19 DIAGNOSIS — F17210 Nicotine dependence, cigarettes, uncomplicated: Secondary | ICD-10-CM | POA: Insufficient documentation

## 2018-08-19 DIAGNOSIS — R079 Chest pain, unspecified: Secondary | ICD-10-CM | POA: Insufficient documentation

## 2018-08-19 DIAGNOSIS — R42 Dizziness and giddiness: Secondary | ICD-10-CM | POA: Insufficient documentation

## 2018-08-19 DIAGNOSIS — I2693 Single subsegmental pulmonary embolism without acute cor pulmonale: Secondary | ICD-10-CM | POA: Insufficient documentation

## 2018-08-19 HISTORY — DX: Acute embolism and thrombosis of unspecified deep veins of unspecified lower extremity: I82.409

## 2018-08-19 LAB — CBC WITH DIFFERENTIAL/PLATELET
Abs Immature Granulocytes: 0.02 10*3/uL (ref 0.00–0.07)
BASOS PCT: 0 %
Basophils Absolute: 0 10*3/uL (ref 0.0–0.1)
Eosinophils Absolute: 0 10*3/uL (ref 0.0–0.5)
Eosinophils Relative: 1 %
HCT: 34.8 % — ABNORMAL LOW (ref 39.0–52.0)
Hemoglobin: 10.9 g/dL — ABNORMAL LOW (ref 13.0–17.0)
Immature Granulocytes: 0 %
Lymphocytes Relative: 34 %
Lymphs Abs: 1.9 10*3/uL (ref 0.7–4.0)
MCH: 30.9 pg (ref 26.0–34.0)
MCHC: 31.3 g/dL (ref 30.0–36.0)
MCV: 98.6 fL (ref 80.0–100.0)
Monocytes Absolute: 0.4 10*3/uL (ref 0.1–1.0)
Monocytes Relative: 7 %
NRBC: 0 % (ref 0.0–0.2)
Neutro Abs: 3.2 10*3/uL (ref 1.7–7.7)
Neutrophils Relative %: 58 %
Platelets: 387 10*3/uL (ref 150–400)
RBC: 3.53 MIL/uL — ABNORMAL LOW (ref 4.22–5.81)
RDW: 13.8 % (ref 11.5–15.5)
WBC: 5.6 10*3/uL (ref 4.0–10.5)

## 2018-08-19 LAB — BASIC METABOLIC PANEL
Anion gap: 7 (ref 5–15)
BUN: 12 mg/dL (ref 6–20)
CO2: 24 mmol/L (ref 22–32)
Calcium: 8.9 mg/dL (ref 8.9–10.3)
Chloride: 106 mmol/L (ref 98–111)
Creatinine, Ser: 0.82 mg/dL (ref 0.61–1.24)
GFR calc Af Amer: >60 mL/min (ref >60)
GFR calc non Af Amer: >60 mL/min (ref >60)
Glucose, Bld: 103 mg/dL — ABNORMAL HIGH (ref 70–99)
Potassium: 3.9 mmol/L (ref 3.5–5.1)
Sodium: 137 mmol/L (ref 135–145)

## 2018-08-19 LAB — I-STAT TROPONIN, ED: Troponin i, poc: 0 ng/mL (ref 0.00–0.08)

## 2018-08-19 LAB — TROPONIN I: Troponin I: <0.03 ng/mL (ref <0.03)

## 2018-08-19 MED ORDER — SODIUM CHLORIDE (PF) 0.9 % IJ SOLN
INTRAMUSCULAR | Status: AC
  Administered 2018-08-19: 15:00:00
  Filled 2018-08-19: qty 50

## 2018-08-19 MED ORDER — IOPAMIDOL (ISOVUE-370) INJECTION 76%
INTRAVENOUS | Status: AC
  Filled 2018-08-19: qty 100

## 2018-08-19 MED ORDER — IOPAMIDOL (ISOVUE-370) INJECTION 76%
100.0000 mL | Freq: Once | INTRAVENOUS | Status: AC | PRN
  Administered 2018-08-19: 100 mL via INTRAVENOUS

## 2018-08-19 NOTE — ED Triage Notes (Signed)
Pt reports last week was in here for DVT in left leg. States still having pain in left foot and leg and now in his back and when goes up the stairs is very short of breath. Pt was started on Xeralto.

## 2018-08-19 NOTE — ED Provider Notes (Signed)
[facility]-EMERGENCY DEPT Provider Note   CSN: [id number] Arrival date & time: 08/19/18  1135    History   Chief Complaint Chief Complaint  Patient presents with  . Dizziness  . Leg Pain  . Back Pain    HPI Keimani Simpson is a 53 y.o. male.     Patient is a 53 year old male with past medical history of atrial fibrillation, alcohol abuse, erosive gastritis secondary to alcohol with recent admission at [facility] for hematemesis.  He apparently developed a left lower extremity DVT while in the hospital.  This was diagnosed after discharge and was admitted here.  He returns today with complaints of continued pain in his left leg.  He is also complaining of shortness of breath.  He states that when he "climbs 1 set of stairs, it feels as though he has climbed 25".  He denies fevers or chills.  He denies chest pain.  He also complains of feeling dizzy and lightheaded and complains of ongoing pain in his left leg.  The history is provided by the patient.    Past Medical History:  Diagnosis Date  . Atrial fibrillation (HCC)   . DVT (deep venous thrombosis) (HCC)   . ETOH abuse   . MI (myocardial infarction) [facility])     Patient Active Problem List   Diagnosis Date Noted  . Alcohol withdrawal (HCC) 08/14/2018  . DVT (deep venous thrombosis) (HCC) 08/12/2018  . Chest pain 08/12/2018  . Alcohol abuse 08/12/2018  . Tobacco abuse 08/12/2018  . History of hematemesis 08/12/2018    Past Surgical History:  Procedure Laterality Date  . SHOULDER SURGERY          Home Medications    Prior to Admission medications   Medication Sig Start Date End Date Taking? Authorizing Provider  acetaminophen (TYLENOL) 500 MG tablet Take 1,000 mg by mouth every 6 (six) hours as needed for mild pain, moderate pain or headache.    [provider]  hydrOXYzine (ATARAX/VISTARIL) 10 MG tablet Take 1 tablet (10 mg total) by mouth 3 (three) times daily as needed for  anxiety (use 2nd). 08/15/18   Jerald Kief, MD  pantoprazole (PROTONIX) 40 MG tablet Take 40 mg by mouth daily. 08/07/18   [provider]  Rivaroxaban 15 & 20 MG TBPK Take as directed on package: Start with one 15mg  tablet by mouth twice a day with food. On Day 22, switch to one 20mg  tablet once a day with food. 08/15/18   Jerald Kief, MD    Family History Family History  Problem Relation Age of Onset  . Cancer Mother   . Cancer Father     Social History Social History   Tobacco Use  . Smoking status: Current Every Day Smoker    Packs/day: 0.50    Types: Cigarettes  . Smokeless tobacco: Never Used  Substance Use Topics  . Alcohol use: Yes    Comment: daily use-8 pack of wine and several pints of beer and vodka   . Drug use: Never     Allergies   Patient has no known allergies.   Review of Systems Review of Systems  All other systems reviewed and are negative.    Physical Exam Updated Vital Signs BP 111/78   Pulse 64   Temp 98 F (36.7 C) (Oral)   Resp 20   Ht 5\' 10"  (1.778 m)   Wt 74.1 kg   SpO2 98%   BMI 23.44 kg/m  Physical Exam Vitals signs and nursing note reviewed.  Constitutional:      General: He is not in acute distress.    Appearance: He is well-developed. He is not diaphoretic.  HENT:     Head: Normocephalic and atraumatic.  Neck:     Musculoskeletal: Normal range of motion and neck supple.  Cardiovascular:     Rate and Rhythm: Normal rate and regular rhythm.     Heart sounds: No murmur. No friction rub.  Pulmonary:     Effort: Pulmonary effort is normal. No respiratory distress.     Breath sounds: Normal breath sounds. No wheezing or rales.  Abdominal:     General: Bowel sounds are normal. There is no distension.     Palpations: Abdomen is soft.     Tenderness: There is no abdominal tenderness.  Musculoskeletal: Normal range of motion.        General: No swelling or tenderness.     Right lower leg: No edema.     Left  lower leg: No edema.  Skin:    General: Skin is warm and dry.  Neurological:     Mental Status: He is alert and oriented to person, place, and time.     Coordination: Coordination normal.      ED Treatments / Results  Labs (all labs ordered are listed, but only abnormal results are displayed) Labs Reviewed  TROPONIN I  BASIC METABOLIC PANEL  CBC WITH DIFFERENTIAL/PLATELET  I-STAT TROPONIN, ED    EKG EKG Interpretation  Date/Time:  Wednesday August 19 2018 11:58:20 EST Ventricular Rate:  62 PR Interval:    QRS Duration: 94 QT Interval:  402 QTC Calculation: 409 R Axis:   57 Text Interpretation:  Sinus rhythm Normal ECG Confirmed by Geoffery Lyons ([id number]) on 08/19/2018 12:11:43 PM   Radiology No results found.  Procedures Procedures (including critical care time)  Medications Ordered in ED Medications - No data to display   Initial Impression / Assessment and Plan / ED Course  I have reviewed the triage vital signs and the nursing notes.  Pertinent labs & imaging results that were available during my care of the patient were reviewed by me and considered in my medical decision making (see chart for details).  Patient presenting here with complaints of dizziness, blurry vision, chest pain, and shortness of breath.  His work-up reveals a head CT that is negative, chest CT shows a small subsegmental embolus in the right lower lobe that appears unchanged from prior examination.  His heart rate is in the 50s, vitals are stable, and oxygen saturations are 100%.  I see no reason to change the course of anticoagulation therapy he is already initiated.  I will advise him to continue Xarelto and follow-up with his primary doctor.  Final Clinical Impressions(s) / ED Diagnoses   Final diagnoses:  None    ED Discharge Orders    None       Geoffery Lyons, MD 08/19/18 [phone]

## 2018-08-19 NOTE — Discharge Instructions (Addendum)
Continue medications as previously prescribed.  Follow-up with your primary doctor in the next week if symptoms are not improving.

## 2018-08-20 ENCOUNTER — Other Ambulatory Visit: Payer: Self-pay

## 2018-08-20 ENCOUNTER — Emergency Department (HOSPITAL_COMMUNITY)
Admission: EM | Admit: 2018-08-20 | Discharge: 2018-08-20 | Disposition: A | Discharge status: Home / Self Care | Payer: BLUE CROSS/BLUE SHIELD | Attending: Emergency Medicine | Admitting: Emergency Medicine

## 2018-08-20 ENCOUNTER — Encounter (HOSPITAL_COMMUNITY): Payer: Self-pay

## 2018-08-20 DIAGNOSIS — F17210 Nicotine dependence, cigarettes, uncomplicated: Secondary | ICD-10-CM | POA: Insufficient documentation

## 2018-08-20 DIAGNOSIS — M542 Cervicalgia: Secondary | ICD-10-CM | POA: Insufficient documentation

## 2018-08-20 DIAGNOSIS — M25512 Pain in left shoulder: Secondary | ICD-10-CM

## 2018-08-20 DIAGNOSIS — I252 Old myocardial infarction: Secondary | ICD-10-CM | POA: Insufficient documentation

## 2018-08-20 DIAGNOSIS — F10930 Alcohol use, unspecified with withdrawal, uncomplicated: Secondary | ICD-10-CM

## 2018-08-20 DIAGNOSIS — F10230 Alcohol dependence with withdrawal, uncomplicated: Secondary | ICD-10-CM | POA: Insufficient documentation

## 2018-08-20 DIAGNOSIS — Z79899 Other long term (current) drug therapy: Secondary | ICD-10-CM | POA: Insufficient documentation

## 2018-08-20 MED ORDER — CHLORDIAZEPOXIDE HCL 25 MG PO CAPS: ORAL_CAPSULE | ORAL | 0 refills | Status: DC

## 2018-08-20 NOTE — Discharge Instructions (Signed)
Can take tylenol as needed for pain in the shoulder

## 2018-08-20 NOTE — ED Notes (Signed)
This RN went into room to give patient discharge instructions. Patient appears to have left the Emergency Department. This writer is unable to assess patient before discharge and give patient discharge instructions.

## 2018-08-20 NOTE — ED Provider Notes (Signed)
[facility]-EMERGENCY DEPT Provider Note   CSN: [id number] Arrival date & time: 08/20/18  1136    History   Chief Complaint Chief Complaint  Patient presents with  . Shoulder Pain  . Neck Pain    HPI Randall Simpson is a 53 y.o. male.     Patient is a 53 year old male with a history of alcohol abuse and recent alcohol withdrawal and erosive gastritis treated at [facility].  Patient then developed a DVT and a sub-segmental right lower PE and in the last week has started on Xarelto.  Patient was seen last night for dizziness and back pain.  Patient had EKG, chest x-ray, labs including a troponin which were all within normal limits.  He had a CT that showed a subsegmental PE but no acute change.  Patient returns today because he states after getting home yesterday he started having some pain in his left posterior shoulder and neck region.  It is worse with palpation and moving the arm a certain way but he denies any numbness or tingling in the left arm.  He has had no new chest pain or shortness of breath.  He denies any injury and has not noticed any rashes.  He does note that since being home he has not had anything to drink for 9 days and he still feeling a bit shaky.  He has not had any vomiting or shortness of breath.  The history is provided by the patient.  Shoulder Pain  Location:  Shoulder Shoulder location:  L shoulder Injury: no   Pain details:    Quality:  Shooting and cramping   Radiates to:  Does not radiate   Severity:  Moderate   Onset quality:  Gradual   Duration:  12 hours   Timing:  Constant   Progression:  Unchanged Handedness:  Right-handed Foreign body present:  No foreign bodies Prior injury to area:  No Relieved by:  None tried Worsened by:  Stretching area Ineffective treatments:  None tried Associated symptoms: neck pain and stiffness   Associated symptoms: no back pain, no decreased range of motion, no fever, no muscle  weakness, no numbness and no swelling   Neck Pain  Associated symptoms: no fever     Past Medical History:  Diagnosis Date  . Atrial fibrillation (HCC)   . DVT (deep venous thrombosis) (HCC)   . ETOH abuse   . MI (myocardial infarction) [facility])     Patient Active Problem List   Diagnosis Date Noted  . Alcohol withdrawal (HCC) 08/14/2018  . DVT (deep venous thrombosis) (HCC) 08/12/2018  . Chest pain 08/12/2018  . Alcohol abuse 08/12/2018  . Tobacco abuse 08/12/2018  . History of hematemesis 08/12/2018    Past Surgical History:  Procedure Laterality Date  . SHOULDER SURGERY          Home Medications    Prior to Admission medications   Medication Sig Start Date End Date Taking? Authorizing Provider  acetaminophen (TYLENOL) 500 MG tablet Take 1,000 mg by mouth every 6 (six) hours as needed for mild pain, moderate pain or headache.    [provider]  albuterol (PROVENTIL HFA;VENTOLIN HFA) 108 (90 Base) MCG/ACT inhaler Inhale 2 puffs into the lungs every 6 (six) hours as needed for wheezing or shortness of breath.    [provider]  chlordiazePOXIDE (LIBRIUM) 25 MG capsule 50mg  PO TID x 1D, then 25-50mg  PO BID X 1D, then 25-50mg  PO QD X 1D 08/20/18  Gwyneth Sprout, MD  hydrOXYzine (ATARAX/VISTARIL) 10 MG tablet Take 1 tablet (10 mg total) by mouth 3 (three) times daily as needed for anxiety (use 2nd). 08/15/18   Jerald Kief, MD  Multiple Vitamin (MULTIVITAMIN WITH MINERALS) TABS tablet Take 1 tablet by mouth daily.    [provider]  pantoprazole (PROTONIX) 40 MG tablet Take 40 mg by mouth daily. 08/07/18   [provider]  polyvinyl alcohol (LIQUIFILM TEARS) 1.4 % ophthalmic solution Place 1 drop into both eyes as needed for dry eyes.    [provider]  Rivaroxaban 15 & 20 MG TBPK Take as directed on package: Start with one 15mg  tablet by mouth twice a day with food. On Day 22, switch to one 20mg  tablet once a day with food.  08/15/18   Jerald Kief, MD    Family History Family History  Problem Relation Age of Onset  . Cancer Mother   . Cancer Father     Social History Social History   Tobacco Use  . Smoking status: Current Every Day Smoker    Packs/day: 0.50    Types: Cigarettes  . Smokeless tobacco: Never Used  Substance Use Topics  . Alcohol use: Yes    Comment: daily use-8 pack of wine and several pints of beer and vodka   . Drug use: Never     Allergies   Patient has no known allergies.   Review of Systems Review of Systems  Constitutional: Negative for fever.  Musculoskeletal: Positive for neck pain and stiffness. Negative for back pain.  All other systems reviewed and are negative.    Physical Exam Updated Vital Signs BP 121/86 (BP Location: Right Arm)   Pulse 79   Temp 98.2 F (36.8 C) (Oral)   Resp 10   Ht 5\' 10"  (1.778 m)   Wt 74.8 kg   SpO2 99%   BMI 23.68 kg/m   Physical Exam Vitals signs and nursing note reviewed.  Constitutional:      General: He is not in acute distress.    Appearance: He is well-developed.  HENT:     Head: Normocephalic and atraumatic.  Eyes:     Conjunctiva/sclera: Conjunctivae normal.     Pupils: Pupils are equal, round, and reactive to light.  Neck:     Musculoskeletal: Normal range of motion and neck supple.   Cardiovascular:     Rate and Rhythm: Normal rate and regular rhythm.     Heart sounds: No murmur.  Pulmonary:     Effort: Pulmonary effort is normal. No respiratory distress.     Breath sounds: Normal breath sounds. No wheezing or rales.  Abdominal:     General: There is no distension.     Palpations: Abdomen is soft.     Tenderness: There is no abdominal tenderness. There is no guarding or rebound.  Musculoskeletal: Normal range of motion.        General: No tenderness.  Skin:    General: Skin is warm and dry.     Findings: No erythema or rash.  Neurological:     General: No focal deficit present.     Mental  Status: He is alert and oriented to person, place, and time. Mental status is at baseline.     Comments: Right hand with mild tremor resting and worse with intention.  No tongue fasciculations and much less of a tremor in the left hand  Psychiatric:        Behavior: Behavior normal.  ED Treatments / Results  Labs (all labs ordered are listed, but only abnormal results are displayed) Labs Reviewed - No data to display  EKG EKG Interpretation  Date/Time:  Thursday August 20 2018 12:58:17 EST Ventricular Rate:  63 PR Interval:    QRS Duration: 90 QT Interval:  393 QTC Calculation: 403 R Axis:   44 Text Interpretation:  Sinus rhythm Normal ECG No significant change since last tracing Confirmed by Gwyneth SproutPlunkett, Destynie Toomey ([id number]) on 08/20/2018 3:17:12 PM   Radiology Ct Head Wo Contrast  Result Date: 08/19/2018 CLINICAL DATA:  Focal neuro deficit for greater than 6 hours. Abnormal vision. Segmental pulmonary emboli. EXAM: CT HEAD WITHOUT CONTRAST TECHNIQUE: Contiguous axial images were obtained from the base of the skull through the vertex without intravenous contrast. COMPARISON:  None. FINDINGS: Brain: No acute infarct, hemorrhage, or mass lesion is present. The ventricles are of normal size. Minimal white matter changes are present. Basal ganglia are intact. No significant extraaxial fluid collection is present. The brainstem and cerebellum are within normal limits. Vascular: No hyperdense vessel or unexpected calcification. Skull: Calvarium is intact. No focal lytic or blastic lesions are present. Sinuses/Orbits: The paranasal sinuses and mastoid air cells are clear. The globes and orbits are within normal limits. IMPRESSION: 1. No acute intracranial abnormality or focal lesion to explain abnormal vision. Normal CT the head for age. Electronically Signed   By: Marin Robertshristopher  Mattern M.D.   On: 08/19/2018 16:27   Ct Angio Chest Pe W And/or Wo Contrast  Result Date: 08/19/2018 CLINICAL DATA:   DVT, rule out PE EXAM: CT ANGIOGRAPHY CHEST WITH CONTRAST TECHNIQUE: Multidetector CT imaging of the chest was performed using the standard protocol during bolus administration of intravenous contrast. Multiplanar CT image reconstructions and MIPs were obtained to evaluate the vascular anatomy. CONTRAST:  <See Chart> ISOVUE-370 IOPAMIDOL (ISOVUE-370) INJECTION 76% COMPARISON:  08/12/2018 FINDINGS: Cardiovascular: Positive examination for pulmonary emboli. Distal segmental to subsegmental embolus present in the right lower lobe (series 5, image 227). This appears unchanged compared to prior examination. There is no definite embolus identified elsewhere. RV LV ratio = 1.1. No pericardial effusion. Mediastinum/Nodes: No enlarged mediastinal, hilar, or axillary lymph nodes. Thyroid gland, trachea, and esophagus demonstrate no significant findings. Lungs/Pleura: Bibasilar scarring or atelectasis. No pleural effusion or pneumothorax. Upper Abdomen: No acute abnormality. Musculoskeletal: No chest wall abnormality. No acute or significant osseous findings. Review of the MIP images confirms the above findings. IMPRESSION: 1. Positive examination for pulmonary emboli. Distal segmental to subsegmental embolus present in the right lower lobe (series 5, image 227). This appears unchanged compared to prior examination. There is no definite embolus identified elsewhere. 2. RV LV ratio = 1.1. Correlate for clinical evidence of right heart strain and consider echocardiography, although heart strain secondary to pulmonary embolus seems unlikely small and distal burden of embolus. 3.  Bibasilar scarring or atelectasis. These results were called by telephone at the time of interpretation on 08/19/2018 at 3:57 pm to Dr. Geoffery LyonsUGLAS DELO , who verbally acknowledged these results. Electronically Signed   By: Lauralyn PrimesAlex  Bibbey M.D.   On: 08/19/2018 15:58    Procedures Procedures (including critical care time)  Medications Ordered in  ED Medications - No data to display   Initial Impression / Assessment and Plan / ED Course  I have reviewed the triage vital signs and the nursing notes.  Pertinent labs & imaging results that were available during my care of the patient were reviewed by me and considered in my medical decision  making (see chart for details).       Patient presenting today with some discomfort in the left side of his back and shoulder area.  It started last night while he was watching TV.  He denies any new chest pain or shortness of breath.  No numbness or tingling in his arm.  He is also noted ongoing tremulousness since being discharged from the hospital.  He was recently diagnosed with DVT, PE and is currently on Xarelto.  He has continued to take the medication and has not missed any doses.  He also reports because of binge drinking for 3 to 4 weeks he had to be admitted to [facility] which he was treated for gastritis and alcohol withdrawal.  While he was there is when he developed the DVT.  He states that he was supposed to take Vistaril and gabapentin for withdrawal but he was unable to afford those medications.  He has not had anything to drink for 9 days but still feels shaky.  He denies any seizures or hallucinations.  On exam he has some muscular tenderness in the lateral left neck and posterior shoulder area with palpable spasm.  There is no rash to suggest shingles at this time.  Pulses are normal and he is otherwise well-appearing.  Vital signs are within normal limits.  He is slightly tremulous on exam and feel it is most likely related to still ongoing mild alcohol withdrawal.  Discussed with patient about being mindful and looking for a rash to ensure this does not develop into shingles.  Also he was given Librium for the next few days to take for ongoing alcohol withdrawal symptoms.  He has follow-up with health and wellness March 4 but was encouraged to return here if symptoms worsen or if he  develops a rash as he would need treatment.  EKG today was normal and patient was seen less than 24 hours ago and at that time had normal renal function and troponin.  Low suspicion that this is cardiac in nature or related to the PE was in the right lower segment of his lung.   Final Clinical Impressions(s) / ED Diagnoses   Final diagnoses:  Acute pain of left shoulder  Alcohol withdrawal syndrome without complication [facility])    ED Discharge Orders         Ordered    chlordiazePOXIDE (LIBRIUM) 25 MG capsule     08/20/18 1310           Gwyneth Sprout, MD 08/20/18 1526

## 2018-08-20 NOTE — ED Triage Notes (Signed)
Pt arrives via pov from home. Pt was seen here yesterday. Pt had known DVT, pt dx yesterday with PE. Pt already on Xeralto. Pt reports today that he now has left shoulder and neck pain. Pt reports continued dizziness.

## 2018-09-02 ENCOUNTER — Encounter: Payer: Self-pay | Admitting: Critical Care Medicine

## 2018-09-02 ENCOUNTER — Other Ambulatory Visit: Payer: Self-pay

## 2018-09-02 ENCOUNTER — Ambulatory Visit: Payer: BLUE CROSS/BLUE SHIELD | Attending: Critical Care Medicine | Admitting: Critical Care Medicine

## 2018-09-02 ENCOUNTER — Ambulatory Visit: Payer: BLUE CROSS/BLUE SHIELD | Attending: Family Medicine | Admitting: Licensed Clinical Social Worker

## 2018-09-02 VITALS — BP 116/82 | HR 76 | Temp 98.2°F | Resp 18 | Ht 70.0 in | Wt 165.4 lb

## 2018-09-02 DIAGNOSIS — F10931 Alcohol use, unspecified with withdrawal delirium: Secondary | ICD-10-CM

## 2018-09-02 DIAGNOSIS — I82452 Acute embolism and thrombosis of left peroneal vein: Secondary | ICD-10-CM

## 2018-09-02 DIAGNOSIS — Z1159 Encounter for screening for other viral diseases: Secondary | ICD-10-CM

## 2018-09-02 DIAGNOSIS — I252 Old myocardial infarction: Secondary | ICD-10-CM

## 2018-09-02 DIAGNOSIS — Z720 Tobacco use: Secondary | ICD-10-CM

## 2018-09-02 DIAGNOSIS — F10231 Alcohol dependence with withdrawal delirium: Secondary | ICD-10-CM

## 2018-09-02 DIAGNOSIS — F1020 Alcohol dependence, uncomplicated: Secondary | ICD-10-CM

## 2018-09-02 DIAGNOSIS — Z114 Encounter for screening for human immunodeficiency virus [HIV]: Secondary | ICD-10-CM

## 2018-09-02 DIAGNOSIS — I2693 Single subsegmental pulmonary embolism without acute cor pulmonale: Secondary | ICD-10-CM | POA: Insufficient documentation

## 2018-09-02 DIAGNOSIS — F19980 Other psychoactive substance use, unspecified with psychoactive substance-induced anxiety disorder: Secondary | ICD-10-CM

## 2018-09-02 DIAGNOSIS — E7800 Pure hypercholesterolemia, unspecified: Secondary | ICD-10-CM | POA: Insufficient documentation

## 2018-09-02 MED ORDER — APIXABAN 5 MG PO TABS: ORAL_TABLET | ORAL | 3 refills | Status: DC

## 2018-09-02 MED ORDER — CHLORDIAZEPOXIDE HCL 25 MG PO CAPS: ORAL_CAPSULE | ORAL | 0 refills | Status: DC

## 2018-09-02 MED ORDER — THIAMINE HCL 100 MG PO TABS: 100.0000 mg | ORAL_TABLET | Freq: Every day | ORAL | 4 refills | Status: DC

## 2018-09-02 MED ORDER — FOLIC ACID 1 MG PO TABS: 1.0000 mg | ORAL_TABLET | Freq: Every day | ORAL | 4 refills | Status: DC

## 2018-09-02 MED FILL — FOLIC ACID 1 MG TABS: 1 | 30 days supply | Qty: 30 | Fill #0

## 2018-09-02 MED FILL — !ELIQUIS 5 MG TABLET: 5 | 23 days supply | Qty: 60 | Fill #0

## 2018-09-02 NOTE — Assessment & Plan Note (Signed)
History of alcohol withdrawal syndrome with associated delirium that was treated now the patient appears to be slipping back into more alcohol withdrawal symptom complex  The patient was seen by licensed clinical social worker today to connect the patient with further alcohol counseling  Another course of tapering Librium was given to the patient

## 2018-09-02 NOTE — Patient Instructions (Signed)
Discontinue Xarelto Begin Eliquis 5 mg tablet take 2 of these twice daily for 7 days and then reduce to 1 twice daily and stay at that dose  Labs today will include a complete metabolic panel, blood counts, lipid panel, hepatitis C and HIV screening  Our social work team has met with you to provide you with alcohol treatment services contacts  Work on smoking cessation  Begin thiamine and folic acid daily and these prescriptions were sent to our pharmacy  Resume the Librium protocol that she received in the emergency room I sent that prescription to the [facility] requested  Return to see Dr. Joya Gaskins 2 weeks See below information on alcohol and nutrition  Alcohol Abuse and Nutrition Alcohol abuse is any pattern of alcohol consumption that harms your health, relationships, or work. Alcohol abuse can cause poor nutrition (malnutrition or malnourishment) and a lack of nutrients (nutrient deficiencies), which can lead to more complications. Alcohol abuse brings malnutrition and nutrient deficiencies in two ways:  It causes your liver to work abnormally. This affects how your body divides (breaks down) and absorbs nutrients from food.  It causes you to eat poorly. Many people who abuse alcohol do not eat enough carbohydrates, protein, fat, vitamins, and minerals. Nutrients that are commonly lacking (deficient) in people who abuse alcohol include:  Vitamins. ? Vitamin A. This is needed for your vision, metabolism, and ability to fight off infections (immunity). ? B vitamins. These include folate, thiamine, and niacin. These are needed for new cell growth. ? Vitamin C. This plays an important role in wound healing, immunity, and helping your body to absorb iron. ? Vitamin D. This is necessary for your body to absorb and use calcium. It is produced by your liver, but you can also get it from food and from sun exposure.  Minerals. ? Calcium. This is needed for healthy bones as well as heart and  blood vessel (cardiovascular) function. ? Iron. This is important for blood, muscle, and nervous system functioning. ? Magnesium. This plays an important role in muscle and nerve function, and it helps to control blood sugar and blood pressure. ? Zinc. This is important for the normal functioning of your nervous system and digestive system (gastrointestinal tract). If you think that you have an alcohol dependency problem, or if it is hard to stop drinking because you feel sick or different when you do not use alcohol, talk with your health care provider or another health professional about where to get help. Nutrition is an essential factor in therapy for alcohol abuse. Your health care provider or diet and nutrition specialist (dietitian) will work with you to design a plan that can help to restore nutrients to your body and prevent the risk of complications. What is my plan? Your dietitian may develop a specific eating plan that is based on your condition and any other problems that you have. An eating plan will commonly include:  A balanced diet. ? Grains: 6-8 oz (170-227 g) a day. Examples of 1 oz of whole grains include 1 cup of whole-wheat cereal,  cup of brown rice, or 1 slice of whole-wheat bread. ? Vegetables: 2-3 cups a day. Examples of 1 cup of vegetables include 2 medium carrots, 1 large tomato, or 2 stalks of celery. ? Fruits: 1-2 cups a day. Examples of 1 cup of fruit include 1 large banana, 1 small apple, 8 large strawberries, or 1 large orange. ? Meat and other protein: 5-6 oz (142-170 g) a day.  A cut of meat or fish that is the size of a deck of cards is about 3-4 oz.  Foods that provide 1 oz of protein include 1 egg,  cup of nuts or seeds, or 1 tablespoon (16 g) of peanut butter. ? Dairy: 2-3 cups a day. Examples of 1 cup of dairy include 8 oz (230 mL) of milk, 8 oz (230 g) of yogurt, or 1 oz (44 g) of natural cheese.  Vitamin and mineral supplements. What are tips for  following this plan?  Eat frequent meals and snacks. Try to eat 5-6 small meals each day.  Take vitamin or mineral supplements as recommended by your dietitian.  If you are malnourished or if your dietitian recommends it: ? You may follow a high-protein, high-calorie diet. This may include:  2,000-3,000 calories (kilocalories) a day.  70-100 g (grams) of protein a day. ? You may be directed to follow a diet that includes a complete nutritional supplement beverage. This can help to restore calories, protein, and vitamins to your body. Depending on your condition, you may be advised to consume this beverage instead of your meals or in addition to them.  Certain medicines may cause changes in your appetite, taste, and weight. Work with your health care provider and dietitian to make any changes to your medicines and eating plan.  If you are unable to take in enough food and calories by mouth, your health care provider may recommend a feeding tube. This tube delivers nutritional supplements directly to your stomach. Recommended foods  Eat foods that are high in molecules that prevent oxygen from reacting with your food (antioxidants). These foods include grapes, berries, nuts, green tea, and dark green or orange vegetables. Eating these can help to prevent some of the stress that is placed on your liver by consuming alcohol.  Eat a variety of fresh fruits and vegetables each day. This will help you to get fiber and vitamins in your diet.  Drink plenty of water and other clear fluids, such as apple juice and broth. Try to drink at least 48-64 oz (1.5-2 L) of water a day.  Include foods fortified with vitamins and minerals in your diet. Commonly fortified foods include milk, orange juice, cereal, and bread.  Eat a variety of foods that are high in omega-3 and omega-6 fatty acids. These include fish, nuts and seeds, and soybeans. These foods may help your liver to recover and may also stabilize  your mood.  If you are a vegetarian: ? Eat a variety of protein-rich foods. ? Pair whole grains with plant-based proteins at meals and snack time. For example, eat rice with beans, put peanut butter on whole-grain toast, or eat oatmeal with sunflower seeds. The items listed above may not be a complete list of foods and beverages you can eat. Contact a dietitian for more information. Foods to avoid  Avoid foods and drinks that are high in fat and sugar. Sugary drinks, salty snacks, and candy contain empty calories. This means that they lack important nutrients such as protein, fiber, and vitamins.  Avoid alcohol. This is the best way to avoid malnutrition due to alcohol abuse. If you must drink, drink measured amounts. Measured drinking means limiting your intake to no more than 1 drink a day for nonpregnant women and 2 drinks a day for men. One drink equals 12 oz (355 mL) of beer, 5 oz (148 mL) of wine, or 1 oz (44 mL) of hard liquor.  Limit your intake  of caffeine. Replace drinks like coffee and black tea with decaffeinated coffee and decaffeinated herbal tea. The items listed above may not be a complete list of foods and beverages you should avoid. Contact a dietitian for more information. Summary  Alcohol abuse can cause poor nutrition (malnutrition or malnourishment) and a lack of nutrients (nutrient deficiencies), which can lead to more health problems.  Common nutrient deficiencies include vitamin deficiencies (A, B, C, and D) and mineral deficiencies (calcium, iron, magnesium, and zinc).  Nutrition is an essential factor in therapy for alcohol abuse.  Your health care provider and dietitian can help you to develop a specific eating plan that includes a balanced diet plus vitamin and mineral supplements. This information is not intended to replace advice given to you by your health care provider. Make sure you discuss any questions you have with your health care provider. Document  Released: 04/11/2005 Document Revised: 02/18/2018 Document Reviewed: 03/04/2017 Elsevier Interactive Patient Education  2019 [employer].

## 2018-09-02 NOTE — Progress Notes (Signed)
Patient arrived to the clinic with a follow up need from having a pulmonary embolism.  Patient endorses 5/10 pain in the left foot which is same side as embolism.  Patient states he has chronic chest pain but no worse than normal

## 2018-09-02 NOTE — Assessment & Plan Note (Signed)
Left lower extremity deep venous thrombosis with adverse reaction to Xarelto and under dosage of Xarelto  Discontinue Xarelto and begin Eliquis 10 mg twice daily for 7 days then reduce to 5 mg twice daily thereafter

## 2018-09-02 NOTE — Assessment & Plan Note (Signed)
Smoking cessation counseling was given to the patient 

## 2018-09-02 NOTE — BH Specialist Note (Signed)
Integrated Behavioral Health Initial Visit  MRN: [id number] Name: Randall Simpson  Number of Integrated Behavioral Health Clinician visits:: 1/6 Session Start time: 10:00AM  Session End time: 10:30AM Total time: 30 minutes  Type of Service: Integrated Behavioral Health- Individual Interpretor:No.    Warm Hand Off Completed.       SUBJECTIVE: Randall Simpson is a 53 y.o. male accompanied by SELF Patient was referred by MD Shan Levans for anxiety resources. Patient reports the following symptoms/concerns: Pt is experiencing anxiety with difficulty falling asleep. Pt experiences alcohol lapse and is currently going in AA. Duration of problem: ongoing; Severity of problem: moderate  OBJECTIVE: Mood: Anxious and Affect: Appropriate Risk of harm to self or others: No plan to harm self or others  LIFE CONTEXT: Family and Social: Pt has relocated several times and has minimal family in [place]. School/Work: Pt recently stopped employment two weeks ago and has filed for [employer]. Currently waiting to hear back. Self-Care: Pt is currently having alcohol withdrawals and is going through treatment. Reports he enjoys drawing.  Life Changes: Pt is currently going through alcohol treatment. Pt also has recently lost employment due to medical conditions.  GOALS ADDRESSED: Patient will: 1. Reduce symptoms of: anxiety 2. Increase knowledge and/or ability of: coping skills and healthy habits  3. Demonstrate ability to: Decrease self-medicating behaviorsq  INTERVENTIONS: Interventions utilized: Copywriter, advertising, Supportive Counseling and Sleep Hygiene  Standardized Assessments completed: GAD-7 and PHQ 2&9  ASSESSMENT: Patient currently experiencing anxiety and alcohol withdrawal. Pt denies SI/HI. Pt currently in a sober-living program and is going to AA daily. Pt is currently in the action stage. Pt has had lapses of alcohol use and has returned to action stage.  Reports last usage about two weeks ago. Two weeks ago pt lost employment due to becoming ill. Pt recently filed for [employer] and is waiting on hearing.   Pt acknowledges that he enjoys drawing and wants to go back to it. Believes drawing will help with relaxation, decrease anxiety, and coping with alcohol recovery. Strength of pt is optimism as he persistently stated he will get through alcohol cessation. Acknowledges that sober-living program and AA is beneficial to his recovery. Pt reports that he has trouble falling asleep with anxiety. Pt reports that he believes his anxiety is due to alcohol withdrawal. Pt reports the current program he is only treats substance use.    Patient may benefit from psychotherapy to treat anxiety. MSW intern provided behavioral health resources to pt.  PLAN: 1. Follow up with behavioral health clinician on : MSW intern encouraged pt to schedule appt if needed and inform if assistance is needed with unemployment Chiropodist. 2. Behavioral recommendations: Pt will engage in drawing activities 2-3 times/week to assist with coping skills. Pt will follow-up with behavioral health resources within one month.  3. Referral(s): Integrated Art gallery manager (In Clinic) and [facility] (LME/Outside Clinic) 4. "From scale of 1-10, how likely are you to follow plan?": 7  Lavonna Rua, MSW Intern 09/03/2018, 10:21AM

## 2018-09-02 NOTE — Progress Notes (Addendum)
Subjective:    Patient ID: Randall Simpson, male    DOB: 1965-11-26, 53 y.o.   MRN: [id number]  53 y.o.M who presents to establish for primary care and post hospital ED follow-up.  The patient was initially admitted early February to [facility] because of alcohol withdrawal delirium and associated deep venous thrombosis of the left lower extremity.    Before the patient could be diagnosed he left the at that facility and was called later and told that during that emergency room stay he had deep venous thrombosis he subsequent then presented on February 12 to [facility] long emergency room whereupon he was admitted for deep venous thrombosis left lower extremity.  Patient also had chest pain discomfort with previous history of MI but had negative serial troponins and echocardiogram was unremarkable.  The patient also had elevated CIWA score with alcohol withdrawal therefore during that admission he was given a course of Librium and discharged with a tapering dose of the Librium.  The patient was discharged with Xarelto but almost immediately he developed swelling in the lips and throat pain with the Xarelto and only took a 15 mg once daily dose.  He then presented on 19 February with increased lower chest pain found to have a subsegmental pulmonary embolism in the right lower lobe which interestingly he did not have on a previous CT angiogram from February 12.  After discharge the patient is only been taking the Xarelto to 15 mg once daily dosing and he is now out of Librium and is beginning to notice increased alcohol withdrawal symptoms.  He is noting he is shaky and irritable.  He has been drinking his entire life quitting only sporadically.  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  He currently does not have a job.  He was traveling from [place] to [place] when he fell [place].  He had an  myocardial infarction 2 years ago in [place].  He states they did not place a stent at that time.  The patient's not on any anticholesterol medication.  The patient's not on any regular medications except for Protonix daily.  Patient states he still having left lower extremity pain and some chest discomfort.  The patient is currently living in a halfway house and does go to AA but has no other counseling support  Past Medical History:  Diagnosis Date  . Atrial fibrillation (HCC)   . Carotid artery disease (HCC)   . DVT (deep venous thrombosis) (HCC)   . ETOH abuse   . MI (myocardial infarction) (HCC) 06/02/2017   [place]     Family History  Problem Relation Age of Onset  . Cancer Mother   . Cancer Father      Social History   Socioeconomic History  . Marital status: Single    Spouse name: Not on file  . Number of children: Not on file  . Years of education: Not on file  . Highest education level: Not on file  Occupational History  . Not on file  Social Needs  . Financial resource strain: Not on file  . Food insecurity:    Worry: Not on file    Inability: Not on file  . Transportation needs:    Medical: Not on file    Non-medical: Not on file  Tobacco Use  . Smoking status: Current Every Day Smoker  Packs/day: 0.50    Types: Cigarettes  . Smokeless tobacco: Never Used  Substance and Sexual Activity  . Alcohol use: Yes    Comment: daily use-8 pack of wine and several pints of beer and vodka   . Drug use: Never  . Sexual activity: Not on file  Lifestyle  . Physical activity:    Days per week: Not on file    Minutes per session: Not on file  . Stress: Not on file  Relationships  . Social connections:    Talks on phone: Not on file    Gets together: Not on file    Attends religious service: Not on file    Active member of club or organization: Not on file    Attends meetings of clubs or organizations: Not on file    Relationship status: Not on file  .  Intimate partner violence:    Fear of current or ex partner: Not on file    Emotionally abused: Not on file    Physically abused: Not on file    Forced sexual activity: Not on file  Other Topics Concern  . Not on file  Social History Narrative  . Not on file     Allergies  Allergen Reactions  . Xarelto [Rivaroxaban] Other (See Comments)    Difficulty swallowing     Outpatient Medications Prior to Visit  Medication Sig Dispense Refill  . acetaminophen (TYLENOL) 500 MG tablet Take 1,000 mg by mouth every 6 (six) hours as needed for mild pain, moderate pain or headache.    . albuterol (PROVENTIL HFA;VENTOLIN HFA) 108 (90 Base) MCG/ACT inhaler Inhale 2 puffs into the lungs every 6 (six) hours as needed for wheezing or shortness of breath.    . Multiple Vitamin (MULTIVITAMIN WITH MINERALS) TABS tablet Take 1 tablet by mouth daily.    . pantoprazole (PROTONIX) 40 MG tablet Take 40 mg by mouth daily.    . polyvinyl alcohol (LIQUIFILM TEARS) 1.4 % ophthalmic solution Place 1 drop into both eyes as needed for dry eyes.    . Rivaroxaban 15 & 20 MG TBPK Take as directed on package: Start with one  tablet by mouth twice a day with food. On Day 22, switch to one  tablet once a day with food. 51 each 0  . hydrOXYzine (ATARAX/VISTARIL) 10 MG tablet Take 1 tablet (10 mg total) by mouth 3 (three) times daily as needed for anxiety (use 2nd). (Patient not taking: Reported on 09/02/2018) 30 tablet 0  . chlordiazePOXIDE (LIBRIUM) 25 MG capsule  PO TID x 1D, then 25-50mg  PO BID X 1D, then 25-50mg  PO QD X 1D (Patient not taking: Reported on 09/02/2018) 10 capsule 0   No facility-administered medications prior to visit.       Review of Systems  Constitutional: Positive for appetite change. Negative for fever.  HENT: Positive for facial swelling.   Respiratory: Positive for shortness of breath. Negative for cough.   Cardiovascular: Positive for chest pain and leg swelling.  Gastrointestinal:  Negative.   Genitourinary: Negative.   Musculoskeletal: Negative.   Skin: Negative.   Neurological: Negative.   Psychiatric/Behavioral: Positive for confusion, decreased concentration, dysphoric mood and sleep disturbance. Negative for self-injury and suicidal ideas. The patient is nervous/anxious.        Objective:   Physical Exam Vitals:   09/02/18 0913  BP: 116/82  Pulse: 76  Resp: 18  Temp: 98.2 F (36.8 C)  TempSrc: Oral  SpO2: 100%  Weight: 165 lb  6.4 oz (75 kg)  Height: 5\' 10"  (1.778 m)    Gen: Pleasant, well-nourished, in no distress,  normal affect  ENT: No lesions,  mouth clear,  oropharynx clear, no postnasal drip  Neck: No JVD, no TMG, no carotid bruits  Lungs: No use of accessory muscles, no dullness to percussion, clear without rales or rhonchi  Cardiovascular: RRR, heart sounds normal, no murmur or gallops, no peripheral edema  Abdomen: soft and NT, no HSM,  BS normal  Musculoskeletal: No deformities, no cyanosis or clubbing There is tenderness in the left lower extremity in the calf muscle area Neuro: alert, non focal  Skin: Warm, no lesions or rashes  2/12 CT Angio chest : Neg for PE DVT in LE  CT Angio 2/19 IMPRESSION: 1. Positive examination for pulmonary emboli. Distal segmental to subsegmental embolus present in the right lower lobe (series 5, image 227). This appears unchanged compared to prior examination. There is no definite embolus identified elsewhere.  2. RV LV ratio = 1.1. Correlate for clinical evidence of right heart strain and consider echocardiography, although heart strain secondary to pulmonary embolus seems unlikely small and distal burden of embolus.  3.  Bibasilar scarring or atelectasis.  These results were called by telephone at the time of interpretation on 08/19/2018 at 3:57 pm to Dr. Geoffery Lyons , who verbally acknowledged these results.   2/13 echo FINDINGS  Left Ventricle: The left ventricle has normal  systolic function, with an ejection fraction of 60-65%. The cavity size was normal. There is no increase in left ventricular wall thickness. Left ventricular diastolic parameters were normal Right Ventricle: The right ventricle has normal systolic function. The cavity was normal. There is no increase in right ventricular wall thickness. The right ventricle was not well visualized. Left Atrium: left atrial size was normal in size Right Atrium: right atrial size was normal in size Interatrial Septum: No atrial level shunt detected by color flow Doppler. Pericardium: There is no evidence of pericardial effusion. Mitral Valve: The mitral valve is normal in structure. Mitral valve regurgitation is trivial by color flow Doppler. Tricuspid Valve: The tricuspid valve is normal in structure. Tricuspid valve regurgitation is trivial by color flow Doppler. Aortic Valve: The aortic valve is tricuspid There is mild thickening of the aortic valve. Aortic valve regurgitation was not visualized by color flow Doppler. Pulmonic Valve: The pulmonic valve was normal in structure. Pulmonic valve regurgitation is not visualized by color flow Doppler. Venous: The inferior vena cava is normal in size with greater than 50% respiratory variability      Assessment & Plan:  I personally reviewed all images and lab data in the [facility] system as well as any outside material available during this office visit and agree with the  radiology impressions.   DVT (deep venous thrombosis) (HCC) Left lower extremity deep venous thrombosis with adverse reaction to Xarelto and under dosage of Xarelto  Discontinue Xarelto and begin Eliquis 10 mg twice daily for 7 days then reduce to 5 mg twice daily thereafter  History of MI (myocardial infarction) History of myocardial infarction in [place] 2 years ago currently no evidence of active cardiac ischemia  Alcohol withdrawal syndrome, with delirium (HCC) History of alcohol  withdrawal syndrome with associated delirium that was treated now the patient appears to be slipping back into more alcohol withdrawal symptom complex  The patient was seen by licensed clinical social worker today to connect the patient with further alcohol counseling  Another course of tapering  Librium was given to the patient  Tobacco abuse Smoking cessation counseling was given to the patient  Pure hypercholesterolemia With history of coronary artery disease and myocardial infarction will check lipid panel to assess for clinical cholesterol elevation    Single subsegmental pulmonary embolism without acute cor pulmonale Will treat pulmonary embolism with Eliquis at the same time as treating the deep venous thrombosis   Chloe was seen today for follow-up.  Diagnoses and all orders for this visit:  Acute deep vein thrombosis (DVT) of left peroneal vein -     CBC with Differential/Platelet  History of MI (myocardial infarction)  Single subsegmental pulmonary embolism without acute cor pulmonale -     CBC with Differential/Platelet  Alcohol withdrawal syndrome, with delirium (HCC) -     Comprehensive metabolic panel -     CBC with Differential/Platelet  Pure hypercholesterolemia -     Lipid Panel  Need for hepatitis C screening test -     Hepatitis C Antibody  Encounter for screening for HIV -     HIV antibody (with reflex)  Tobacco abuse  Other orders -     chlordiazePOXIDE (LIBRIUM) 25 MG capsule; 50mg  PO TID x 1D, then 25-50mg  PO BID X 1D, then 25-50mg  PO QD X 1D -     apixaban (ELIQUIS) 5 MG TABS tablet; Take two twice daily for 7 days then one twice daily -     thiamine 100 MG tablet; Take 1 tablet (100 mg total) by mouth daily. -     folic acid (FOLVITE) 1 MG tablet; Take 1 tablet (1 mg total) by mouth daily.   A tetanus vaccine and flu vaccine were administered  We will also screen for hepatitis C and HIV

## 2018-09-02 NOTE — Assessment & Plan Note (Signed)
Will treat pulmonary embolism with Eliquis at the same time as treating the deep venous thrombosis

## 2018-09-02 NOTE — Assessment & Plan Note (Signed)
With history of coronary artery disease and myocardial infarction will check lipid panel to assess for clinical cholesterol elevation

## 2018-09-02 NOTE — Assessment & Plan Note (Signed)
History of myocardial infarction in [place] 2 years ago currently no evidence of active cardiac ischemia

## 2018-09-03 ENCOUNTER — Other Ambulatory Visit: Payer: Self-pay | Admitting: Critical Care Medicine

## 2018-09-03 LAB — COMPREHENSIVE METABOLIC PANEL
A/G RATIO: 2 (ref 1.2–2.2)
ALT: 12 IU/L (ref 0–44)
AST: 13 IU/L (ref 0–40)
Albumin: 4.6 g/dL (ref 3.8–4.9)
Alkaline Phosphatase: 85 IU/L (ref 39–117)
BUN / CREAT RATIO: 15 (ref 9–20)
BUN: 12 mg/dL (ref 6–24)
Bilirubin Total: 0.3 mg/dL (ref 0.0–1.2)
CO2: 22 mmol/L (ref 20–29)
Calcium: 10.1 mg/dL (ref 8.7–10.2)
Chloride: 102 mmol/L (ref 96–106)
Creatinine, Ser: 0.81 mg/dL (ref 0.76–1.27)
GFR calc Af Amer: 116 mL/min/{1.73_m2} (ref >59)
GFR calc non Af Amer: 101 mL/min/{1.73_m2} (ref >59)
Globulin, Total: 2.3 g/dL (ref 1.5–4.5)
Glucose: 87 mg/dL (ref 65–99)
Potassium: 5.1 mmol/L (ref 3.5–5.2)
Sodium: 140 mmol/L (ref 134–144)
Total Protein: 6.9 g/dL (ref 6.0–8.5)

## 2018-09-03 LAB — CBC WITH DIFFERENTIAL/PLATELET
Basophils Absolute: 0 10*3/uL (ref 0.0–0.2)
Basos: 0 %
EOS (ABSOLUTE): 0.1 10*3/uL (ref 0.0–0.4)
Eos: 1 %
Hematocrit: 38.7 % (ref 37.5–51.0)
Hemoglobin: 13.1 g/dL (ref 13.0–17.7)
Immature Grans (Abs): 0 10*3/uL (ref 0.0–0.1)
Immature Granulocytes: 0 %
Lymphocytes Absolute: 2.1 10*3/uL (ref 0.7–3.1)
Lymphs: 28 %
MCH: 30.2 pg (ref 26.6–33.0)
MCHC: 33.9 g/dL (ref 31.5–35.7)
MCV: 89 fL (ref 79–97)
Monocytes Absolute: 0.5 10*3/uL (ref 0.1–0.9)
Monocytes: 7 %
Neutrophils Absolute: 4.7 10*3/uL (ref 1.4–7.0)
Neutrophils: 64 %
Platelets: 332 10*3/uL (ref 150–450)
RBC: 4.34 x10E6/uL (ref 4.14–5.80)
RDW: 13.6 % (ref 11.6–15.4)
WBC: 7.4 10*3/uL (ref 3.4–10.8)

## 2018-09-03 LAB — LIPID PANEL
Chol/HDL Ratio: 4.1 ratio (ref 0.0–5.0)
Cholesterol, Total: 175 mg/dL (ref 100–199)
HDL: 43 mg/dL (ref >39)
LDL Calculated: 108 mg/dL — ABNORMAL HIGH (ref 0–99)
Triglycerides: 119 mg/dL (ref 0–149)
VLDL Cholesterol Cal: 24 mg/dL (ref 5–40)

## 2018-09-03 LAB — HEPATITIS C ANTIBODY: Hep C Virus Ab: <0.1 s/co ratio (ref 0.0–0.9)

## 2018-09-03 LAB — HIV ANTIBODY (ROUTINE TESTING W REFLEX): HIV Screen 4th Generation wRfx: NONREACTIVE

## 2018-09-03 MED ORDER — ATORVASTATIN CALCIUM 20 MG PO TABS: 20.0000 mg | ORAL_TABLET | Freq: Every day | ORAL | 3 refills | Status: DC

## 2018-09-03 NOTE — Progress Notes (Signed)
Started atorvastatin for increase LDL and CAD

## 2018-09-16 ENCOUNTER — Ambulatory Visit: Payer: BLUE CROSS/BLUE SHIELD | Admitting: Critical Care Medicine

## 2018-09-20 ENCOUNTER — Telehealth: Payer: BLUE CROSS/BLUE SHIELD | Admitting: Physician Assistant

## 2018-09-20 ENCOUNTER — Other Ambulatory Visit: Payer: Self-pay

## 2018-09-20 ENCOUNTER — Emergency Department (HOSPITAL_COMMUNITY): Payer: BLUE CROSS/BLUE SHIELD

## 2018-09-20 ENCOUNTER — Encounter (HOSPITAL_COMMUNITY): Payer: Self-pay | Admitting: Emergency Medicine

## 2018-09-20 ENCOUNTER — Encounter: Payer: Self-pay | Admitting: Physician Assistant

## 2018-09-20 ENCOUNTER — Emergency Department (HOSPITAL_COMMUNITY)
Admission: EM | Admit: 2018-09-20 | Discharge: 2018-09-20 | Disposition: A | Discharge status: Home / Self Care | Payer: BLUE CROSS/BLUE SHIELD | Attending: Emergency Medicine | Admitting: Emergency Medicine

## 2018-09-20 DIAGNOSIS — I2510 Atherosclerotic heart disease of native coronary artery without angina pectoris: Secondary | ICD-10-CM | POA: Insufficient documentation

## 2018-09-20 DIAGNOSIS — F1010 Alcohol abuse, uncomplicated: Secondary | ICD-10-CM | POA: Insufficient documentation

## 2018-09-20 DIAGNOSIS — Z86718 Personal history of other venous thrombosis and embolism: Secondary | ICD-10-CM

## 2018-09-20 DIAGNOSIS — R0602 Shortness of breath: Secondary | ICD-10-CM

## 2018-09-20 DIAGNOSIS — I252 Old myocardial infarction: Secondary | ICD-10-CM | POA: Insufficient documentation

## 2018-09-20 DIAGNOSIS — Z79899 Other long term (current) drug therapy: Secondary | ICD-10-CM | POA: Insufficient documentation

## 2018-09-20 DIAGNOSIS — R059 Cough, unspecified: Secondary | ICD-10-CM

## 2018-09-20 DIAGNOSIS — R0600 Dyspnea, unspecified: Secondary | ICD-10-CM

## 2018-09-20 DIAGNOSIS — F17210 Nicotine dependence, cigarettes, uncomplicated: Secondary | ICD-10-CM | POA: Insufficient documentation

## 2018-09-20 DIAGNOSIS — R05 Cough: Secondary | ICD-10-CM

## 2018-09-20 DIAGNOSIS — Z7901 Long term (current) use of anticoagulants: Secondary | ICD-10-CM | POA: Insufficient documentation

## 2018-09-20 LAB — CBC WITH DIFFERENTIAL/PLATELET
Abs Immature Granulocytes: 0.01 10*3/uL (ref 0.00–0.07)
Basophils Absolute: 0 10*3/uL (ref 0.0–0.1)
Basophils Relative: 0 %
EOS ABS: 0 10*3/uL (ref 0.0–0.5)
EOS PCT: 1 %
HCT: 39.4 % (ref 39.0–52.0)
Hemoglobin: 12.6 g/dL — ABNORMAL LOW (ref 13.0–17.0)
Immature Granulocytes: 0 %
Lymphocytes Relative: 23 %
Lymphs Abs: 2 10*3/uL (ref 0.7–4.0)
MCH: 29.1 pg (ref 26.0–34.0)
MCHC: 32 g/dL (ref 30.0–36.0)
MCV: 91 fL (ref 80.0–100.0)
Monocytes Absolute: 0.6 10*3/uL (ref 0.1–1.0)
Monocytes Relative: 8 %
Neutro Abs: 5.7 10*3/uL (ref 1.7–7.7)
Neutrophils Relative %: 68 %
Platelets: 280 10*3/uL (ref 150–400)
RBC: 4.33 MIL/uL (ref 4.22–5.81)
RDW: 13.7 % (ref 11.5–15.5)
WBC: 8.4 10*3/uL (ref 4.0–10.5)
nRBC: 0 % (ref 0.0–0.2)

## 2018-09-20 LAB — COMPREHENSIVE METABOLIC PANEL
ALT: 15 U/L (ref 0–44)
ANION GAP: 8 (ref 5–15)
AST: 18 U/L (ref 15–41)
Albumin: 4.1 g/dL (ref 3.5–5.0)
Alkaline Phosphatase: 71 U/L (ref 38–126)
BUN: 8 mg/dL (ref 6–20)
CO2: 25 mmol/L (ref 22–32)
Calcium: 8.8 mg/dL — ABNORMAL LOW (ref 8.9–10.3)
Chloride: 106 mmol/L (ref 98–111)
Creatinine, Ser: 0.86 mg/dL (ref 0.61–1.24)
GFR calc Af Amer: >60 mL/min (ref >60)
GFR calc non Af Amer: >60 mL/min (ref >60)
Glucose, Bld: 92 mg/dL (ref 70–99)
Potassium: 3.7 mmol/L (ref 3.5–5.1)
Sodium: 139 mmol/L (ref 135–145)
TOTAL PROTEIN: 7.4 g/dL (ref 6.5–8.1)
Total Bilirubin: 0.8 mg/dL (ref 0.3–1.2)

## 2018-09-20 LAB — I-STAT TROPONIN, ED: Troponin i, poc: 0 ng/mL (ref 0.00–0.08)

## 2018-09-20 LAB — D-DIMER, QUANTITATIVE: D-Dimer, Quant: 0.27 ug/mL-FEU (ref 0.00–0.50)

## 2018-09-20 MED ORDER — PREDNISONE 20 MG PO TABS: 40.0000 mg | ORAL_TABLET | Freq: Every day | ORAL | 0 refills | Status: AC

## 2018-09-20 MED ORDER — PREDNISONE 20 MG PO TABS: 40.0000 mg | ORAL_TABLET | Freq: Every day | ORAL | 0 refills | Status: DC

## 2018-09-20 MED ORDER — IPRATROPIUM-ALBUTEROL 0.5-2.5 (3) MG/3ML IN SOLN
3.0000 mL | Freq: Once | RESPIRATORY_TRACT | Status: AC
  Administered 2018-09-20: 3 mL via RESPIRATORY_TRACT
  Filled 2018-09-20: qty 3

## 2018-09-20 NOTE — Progress Notes (Signed)
  Based on what you shared with me, I feel your condition warrants further evaluation and I recommend that you be seen for a face to face office visit.  Randall Simpson, review of your records shows that you have a recent history of a blood clot (DVT).  Your current symptoms of shortness of breath warrant further workup to rule out blood clot or any other causes, including the covid virus. I recommend going to an urgent care or the Emergency Department for further workup of the shortness of breath and your other symptoms.     NOTE: If you entered your credit card information for this eVisit, you will not be charged. You may see a "hold" on your card for the $35 but that hold will drop off and you will not have a charge processed.  If you are having a true medical emergency please call 911.  If you need an urgent face to face visit, [facility] has four urgent care centers for your convenience.    PLEASE NOTE: THE [facility] LOCATIONS AND URGENT CARE CLINICS DO NOT HAVE THE TESTING FOR CORONAVIRUS COVID19 AVAILABLE.  IF YOU FEEL YOU NEED THIS TEST YOU MUST HAVE AN ORDER TO GO TO A TESTING LOCATION FROM YOUR PROVIDER OR FROM A SCREENING E-VISIT     [email or web address] to reserve your spot online an avoid wait times  [facility] [address] [place], [place] [address] 8 am to 8 pm Monday-Friday 10 am to 4 pm Saturday-Sunday *Across the street from [employer]  [address] [place], [address] 8 am to 5 pm Monday-Friday * In the [facility] on the [facility]   The following sites will take your insurance:  . [facility]  [phone] Get Driving Directions Find a Provider at this Location  [address] [place], [place] [address] . 10 am to 8 pm Monday-Friday . 12 pm to 8 pm Saturday-Sunday   . [facility] at [facility]  [phone] Get Driving Directions Find a  Provider at this Location  [address] [place], [place] [address] . 8 am to 8 pm Monday-Friday . 9 am to 6 pm Saturday . 11 am to 6 pm Sunday   . [facility] Urgent Care at [facility]  [phone] Get Driving Directions  [address] [place], [place] [address] . 8 am to 8 pm Monday-Friday . 8 am to 4 pm Saturday-Sunday   Your e-visit answers were reviewed by a board certified advanced clinical practitioner to complete your personal care plan.  Thank you for using e-Visits.   I have spent 7 min in completion and review of this note- [facility]

## 2018-09-20 NOTE — Discharge Instructions (Signed)
Your chest x-ray today was clear.  Your laboratory results were within normal limits.  I have prescribed a steroid burst, please take 2 tablets once a day for the next 5 days.  You may continue to use your inhaler.  If you experience chest pain, shortness of breath you may return to the emergency department.

## 2018-09-20 NOTE — ED Provider Notes (Signed)
[facility]-EMERGENCY DEPT Provider Note   CSN: [id number] Arrival date & time: 09/20/18  1412    History   Chief Complaint Chief Complaint  Patient presents with  . Shortness of Breath  . Cough    HPI Randall Simpson is a 53 y.o. male.     53 y.o male with a PMH of COPD, DVT, PE presents to the ED with a chief complaint of shortness of breath, cough x yesterday. Patient reports a dry cough, has tried using albuterol for his symptoms with no relief.  He reports using his albuterol inhaler 12-13 times in one day. He also endorses some chest pain worse with deep inspiration. Patient had an E-visit with a provider this morning and was advised to be seen in the ED due to his shortness of breath. Patient has a previous history of DVTs and PEs according to him his last PE was diagnosed a month ago per patient.  Patient is currently on daily Eliquis reports compliance to medication. He denies any abdominal pain, nausea, vomiting, diarrhea or fever.      Past Medical History:  Diagnosis Date  . Atrial fibrillation (HCC)   . Carotid artery disease (HCC)   . DVT (deep venous thrombosis) (HCC)   . ETOH abuse   . MI (myocardial infarction) (HCC) 06/02/2017   [place]    Patient Active Problem List   Diagnosis Date Noted  . Single subsegmental pulmonary embolism without acute cor pulmonale 09/02/2018  . Pure hypercholesterolemia 09/02/2018  . Alcohol withdrawal (HCC) 08/14/2018  . DVT (deep venous thrombosis) (HCC) 08/12/2018  . Alcohol abuse 08/12/2018  . Tobacco abuse 08/12/2018  . Coronary artery disease 08/03/2018  . Alcohol withdrawal syndrome, with delirium (HCC) 08/03/2018  . History of MI (myocardial infarction) 06/02/2017    Past Surgical History:  Procedure Laterality Date  . SHOULDER SURGERY          Home Medications    Prior to Admission medications   Medication Sig Start Date End Date Taking? Authorizing Provider  acetaminophen  (TYLENOL) 500 MG tablet Take 1,000 mg by mouth every 6 (six) hours as needed for mild pain, moderate pain or headache.    [provider]  albuterol (PROVENTIL HFA;VENTOLIN HFA) 108 (90 Base) MCG/ACT inhaler Inhale 2 puffs into the lungs every 6 (six) hours as needed for wheezing or shortness of breath.    [provider]  apixaban (ELIQUIS) 5 MG TABS tablet Take two twice daily for 7 days then one twice daily 09/02/18   [name], MD  atorvastatin (LIPITOR) 20 MG tablet Take 1 tablet (20 mg total) by mouth daily. 09/03/18   [name], MD  chlordiazePOXIDE (LIBRIUM) 25 MG capsule 50mg  PO TID x 1D, then 25-50mg  PO BID X 1D, then 25-50mg  PO QD X 1D 09/02/18   [name], MD  folic acid (FOLVITE) 1 MG tablet Take 1 tablet (1 mg total) by mouth daily. 09/02/18   [name], MD  hydrOXYzine (ATARAX/VISTARIL) 10 MG tablet Take 1 tablet (10 mg total) by mouth 3 (three) times daily as needed for anxiety (use 2nd). Patient not taking: Reported on 09/02/2018 08/15/18   Jerald Kief, MD  Multiple Vitamin (MULTIVITAMIN WITH MINERALS) TABS tablet Take 1 tablet by mouth daily.    [provider]  pantoprazole (PROTONIX) 40 MG tablet Take 40 mg by mouth daily. 08/07/18   [provider]  polyvinyl alcohol (LIQUIFILM TEARS) 1.4 % ophthalmic solution Place  1 drop into both eyes as needed for dry eyes.    [provider]  predniSONE (DELTASONE) 20 MG tablet Take 2 tablets (40 mg total) by mouth daily for 5 days. 09/20/18 09/25/18  Claude Manges, PA-C  thiamine 100 MG tablet Take 1 tablet (100 mg total) by mouth daily. 09/02/18   [name], MD    Family History Family History  Problem Relation Age of Onset  . Cancer Mother   . Cancer Father     Social History Social History   Tobacco Use  . Smoking status: Current Every Day Smoker    Packs/day: 0.50    Types: Cigarettes  . Smokeless tobacco: Never Used  Substance Use Topics  . Alcohol  use: Yes    Comment: daily use-8 pack of wine and several pints of beer and vodka   . Drug use: Never     Allergies   Xarelto [rivaroxaban]   Review of Systems Review of Systems  Constitutional: Negative for chills and fever.  HENT: Negative for ear pain and sore throat.   Eyes: Negative for pain and visual disturbance.  Respiratory: Positive for cough and shortness of breath.   Cardiovascular: Positive for chest pain. Negative for palpitations.  Gastrointestinal: Negative for abdominal pain and vomiting.  Genitourinary: Negative for dysuria and hematuria.  Musculoskeletal: Negative for arthralgias and back pain.  Skin: Negative for color change and rash.  Neurological: Negative for seizures and syncope.  All other systems reviewed and are negative.    Physical Exam Updated Vital Signs BP 125/80   Pulse 71   Temp 98.2 F (36.8 C)   Resp (!) 22   SpO2 95%   Physical Exam Vitals signs and nursing note reviewed.  Constitutional:      Appearance: He is well-developed.  HENT:     Head: Normocephalic and atraumatic.  Eyes:     General: No scleral icterus.    Pupils: Pupils are equal, round, and reactive to light.  Neck:     Musculoskeletal: Normal range of motion.  Cardiovascular:     Heart sounds: Normal heart sounds.  Pulmonary:     Effort: Pulmonary effort is normal.     Breath sounds: Examination of the right-lower field reveals wheezing. Wheezing present.  Chest:     Chest wall: No tenderness.  Abdominal:     General: Bowel sounds are normal. There is no distension.     Palpations: Abdomen is soft.     Tenderness: There is no abdominal tenderness.  Musculoskeletal:        General: No tenderness or deformity.  Skin:    General: Skin is warm and dry.  Neurological:     Mental Status: He is alert and oriented to person, place, and time.      ED Treatments / Results  Labs (all labs ordered are listed, but only abnormal results are displayed) Labs  Reviewed  CBC WITH DIFFERENTIAL/PLATELET - Abnormal; Notable for the following components:      Result Value   Hemoglobin 12.6 (*)    All other components within normal limits  COMPREHENSIVE METABOLIC PANEL - Abnormal; Notable for the following components:   Calcium 8.8 (*)    All other components within normal limits  D-DIMER, QUANTITATIVE (NOT AT [facility])  I-STAT TROPONIN, ED    EKG None  Radiology Dg Chest 2 View  Result Date: 09/20/2018 CLINICAL DATA:  Shortness of breath and cough EXAM: CHEST - 2 VIEW COMPARISON:  08/19/2018 FINDINGS: Cardiac  shadows within normal limits. The lungs are mildly hyperinflated without focal infiltrate or effusion. No acute bony abnormality is noted. IMPRESSION: COPD without acute. Electronically Signed   By: Alcide Clever M.D.   On: 09/20/2018 15:02    Procedures Procedures (including critical care time)  Medications Ordered in ED Medications  ipratropium-albuterol (DUONEB) 0.5-2.5 (3) MG/3ML nebulizer solution 3 mL (3 mLs Nebulization Given 09/20/18 1554)     Initial Impression / Assessment and Plan / ED Course  I have reviewed the triage vital signs and the nursing notes.  Pertinent labs & imaging results that were available during my care of the patient were reviewed by me and considered in my medical decision making (see chart for details).      Patient with a previous history of COPD presents to the ED for shortness of breath.  Patient had an E-visit this morning and was advised to come into the ED due to having a previous history of DVTs.  Patient is currently on Eliquis, reports not missing any dose.  CBC showed no leukocytosis, hemoglobin is within normal limits.  CMP showed no electrolyte normality, LFTs are within normal limits.  D-dimer was obtained which was negative.  Troponin was also negative.  EKG showed no changes consistent with infarct or STEMI.  Lungs were clear to auscultation, mild wheezing on the lower right lobe, breathing  treatment was provided to help with symptoms.  Chest x-ray showed: COPD without acute. No pleural effusion, pneumothorax, consolidation at this time. No tachycardia, hypoxia, patient speaking in full sentences.  At this time will provide patient with a steroid burst to help with his symptoms.  Patient understands and agrees with management, return precautions provided at length.  Patient has no risk factors for coded, he has no recent travel, known exposure to a presents as the positive for coag.  At this time testing was deferred.     Final Clinical Impressions(s) / ED Diagnoses   Final diagnoses:  Cough  Shortness of breath    ED Discharge Orders         Ordered    predniSONE (DELTASONE) 20 MG tablet  Daily,   Status:  Discontinued     09/20/18 1659    predniSONE (DELTASONE) 20 MG tablet  Daily     09/20/18 1722           Claude Manges, Cordelia Poche 09/20/18 Faythe Ghee, MD 09/20/18 [id number]

## 2018-09-20 NOTE — ED Triage Notes (Signed)
Patient c/o sore throat, non productive cough, and SOB since yesterday.

## 2018-09-20 NOTE — ED Notes (Signed)
Patient transported to X-ray 

## 2018-11-16 ENCOUNTER — Encounter (HOSPITAL_COMMUNITY): Payer: Self-pay | Admitting: Emergency Medicine

## 2018-11-16 ENCOUNTER — Ambulatory Visit (HOSPITAL_COMMUNITY)
Admission: RE | Admit: 2018-11-16 | Discharge: 2018-11-16 | Disposition: A | Discharge status: Home / Self Care | Payer: BLUE CROSS/BLUE SHIELD | Source: Home / Self Care | Attending: Psychiatry | Admitting: Psychiatry

## 2018-11-16 ENCOUNTER — Emergency Department (HOSPITAL_COMMUNITY)
Admission: EM | Admit: 2018-11-16 | Discharge: 2018-11-17 | Disposition: A | Discharge status: Other Acute Inpatient Hospital | Payer: BLUE CROSS/BLUE SHIELD | Attending: Emergency Medicine | Admitting: Emergency Medicine

## 2018-11-16 ENCOUNTER — Other Ambulatory Visit: Payer: Self-pay

## 2018-11-16 DIAGNOSIS — Z7901 Long term (current) use of anticoagulants: Secondary | ICD-10-CM | POA: Diagnosis not present

## 2018-11-16 DIAGNOSIS — F1094 Alcohol use, unspecified with alcohol-induced mood disorder: Secondary | ICD-10-CM | POA: Diagnosis present

## 2018-11-16 DIAGNOSIS — I2510 Atherosclerotic heart disease of native coronary artery without angina pectoris: Secondary | ICD-10-CM | POA: Diagnosis not present

## 2018-11-16 DIAGNOSIS — Z79899 Other long term (current) drug therapy: Secondary | ICD-10-CM | POA: Insufficient documentation

## 2018-11-16 DIAGNOSIS — F17210 Nicotine dependence, cigarettes, uncomplicated: Secondary | ICD-10-CM | POA: Insufficient documentation

## 2018-11-16 DIAGNOSIS — F329 Major depressive disorder, single episode, unspecified: Secondary | ICD-10-CM | POA: Diagnosis present

## 2018-11-16 DIAGNOSIS — F32A Depression, unspecified: Secondary | ICD-10-CM

## 2018-11-16 DIAGNOSIS — F1010 Alcohol abuse, uncomplicated: Secondary | ICD-10-CM

## 2018-11-16 LAB — COMPREHENSIVE METABOLIC PANEL
ALT: 115 U/L — ABNORMAL HIGH (ref 0–44)
AST: 103 U/L — ABNORMAL HIGH (ref 15–41)
Albumin: 4.7 g/dL (ref 3.5–5.0)
Alkaline Phosphatase: 84 U/L (ref 38–126)
Anion gap: 14 (ref 5–15)
BUN: 10 mg/dL (ref 6–20)
CO2: 27 mmol/L (ref 22–32)
Calcium: 9.1 mg/dL (ref 8.9–10.3)
Chloride: 92 mmol/L — ABNORMAL LOW (ref 98–111)
Creatinine, Ser: 0.89 mg/dL (ref 0.61–1.24)
GFR calc Af Amer: >60 mL/min (ref >60)
GFR calc non Af Amer: >60 mL/min (ref >60)
Glucose, Bld: 117 mg/dL — ABNORMAL HIGH (ref 70–99)
Potassium: 3.7 mmol/L (ref 3.5–5.1)
Sodium: 133 mmol/L — ABNORMAL LOW (ref 135–145)
Total Bilirubin: 1.5 mg/dL — ABNORMAL HIGH (ref 0.3–1.2)
Total Protein: 8.4 g/dL — ABNORMAL HIGH (ref 6.5–8.1)

## 2018-11-16 LAB — CBC
HCT: 48.5 % (ref 39.0–52.0)
Hemoglobin: 16 g/dL (ref 13.0–17.0)
MCH: 27.4 pg (ref 26.0–34.0)
MCHC: 33 g/dL (ref 30.0–36.0)
MCV: 83 fL (ref 80.0–100.0)
Platelets: 278 10*3/uL (ref 150–400)
RBC: 5.84 MIL/uL — ABNORMAL HIGH (ref 4.22–5.81)
RDW: 15.5 % (ref 11.5–15.5)
WBC: 8.7 10*3/uL (ref 4.0–10.5)
nRBC: 0 % (ref 0.0–0.2)

## 2018-11-16 LAB — ETHANOL: Alcohol, Ethyl (B): 134 mg/dL — ABNORMAL HIGH (ref <10)

## 2018-11-16 LAB — RAPID URINE DRUG SCREEN, HOSP PERFORMED
Amphetamines: NOT DETECTED
Barbiturates: NOT DETECTED
Benzodiazepines: NOT DETECTED
Cocaine: NOT DETECTED
Opiates: NOT DETECTED
Tetrahydrocannabinol: NOT DETECTED

## 2018-11-16 MED ORDER — LORAZEPAM 2 MG/ML IJ SOLN: 0.0000 mg | Freq: Four times a day (QID) | INTRAMUSCULAR | Status: DC

## 2018-11-16 MED ORDER — ATORVASTATIN CALCIUM 20 MG PO TABS
20.0000 mg | ORAL_TABLET | Freq: Every day | ORAL | Status: DC
  Administered 2018-11-16 – 2018-11-17 (×2): 20 mg via ORAL
  Filled 2018-11-16 (×3): qty 1

## 2018-11-16 MED ORDER — LORAZEPAM 2 MG/ML IJ SOLN: 0.0000 mg | Freq: Two times a day (BID) | INTRAMUSCULAR | Status: DC

## 2018-11-16 MED ORDER — ALBUTEROL SULFATE HFA 108 (90 BASE) MCG/ACT IN AERS
2.0000 | INHALATION_SPRAY | Freq: Four times a day (QID) | RESPIRATORY_TRACT | Status: DC | PRN
  Administered 2018-11-16: 2 via RESPIRATORY_TRACT
  Filled 2018-11-16: qty 6.7

## 2018-11-16 MED ORDER — THIAMINE HCL 100 MG/ML IJ SOLN: 100.0000 mg | Freq: Every day | INTRAMUSCULAR | Status: DC

## 2018-11-16 MED ORDER — APIXABAN 5 MG PO TABS
5.0000 mg | ORAL_TABLET | Freq: Two times a day (BID) | ORAL | Status: DC
  Administered 2018-11-16 – 2018-11-17 (×2): 5 mg via ORAL
  Filled 2018-11-16 (×3): qty 1

## 2018-11-16 MED ORDER — LORAZEPAM 1 MG PO TABS
0.0000 mg | ORAL_TABLET | Freq: Four times a day (QID) | ORAL | Status: DC
  Administered 2018-11-17: 2 mg via ORAL
  Administered 2018-11-17: 1 mg via ORAL
  Filled 2018-11-16: qty 1
  Filled 2018-11-16: qty 2

## 2018-11-16 MED ORDER — VITAMIN B-1 100 MG PO TABS
100.0000 mg | ORAL_TABLET | Freq: Every day | ORAL | Status: DC
  Administered 2018-11-16 – 2018-11-17 (×2): 100 mg via ORAL
  Filled 2018-11-16 (×2): qty 1

## 2018-11-16 MED ORDER — FOLIC ACID 1 MG PO TABS
1.0000 mg | ORAL_TABLET | Freq: Every day | ORAL | Status: DC
  Administered 2018-11-16 – 2018-11-17 (×2): 1 mg via ORAL
  Filled 2018-11-16 (×2): qty 1

## 2018-11-16 MED ORDER — ACETAMINOPHEN 500 MG PO TABS: 1000.0000 mg | ORAL_TABLET | Freq: Four times a day (QID) | ORAL | Status: DC | PRN

## 2018-11-16 MED ORDER — LORAZEPAM 1 MG PO TABS
2.0000 mg | ORAL_TABLET | Freq: Once | ORAL | Status: AC
  Administered 2018-11-16: 2 mg via ORAL
  Filled 2018-11-16: qty 2

## 2018-11-16 MED ORDER — LORAZEPAM 1 MG PO TABS: 0.0000 mg | ORAL_TABLET | Freq: Two times a day (BID) | ORAL | Status: DC

## 2018-11-16 MED ORDER — LORAZEPAM 1 MG PO TABS: 1.0000 mg | ORAL_TABLET | Freq: Once | ORAL | Status: DC

## 2018-11-16 NOTE — ED Notes (Addendum)
Pt presents with medical clearance requesting DEtox, pt admits to abusing 3-4 bottles of wine and 12 beers daily.  Pt anxious and tremulous at present.  Cooperative.  Pt reports he has seizures when detoxing from alcohol.  Last seizure 2 mos ago.  A&O x 3, no distress noted.

## 2018-11-16 NOTE — ED Provider Notes (Signed)
[facility]-EMERGENC[facility] DEPT Provider Note   CSN: [id number] Arrival date & time: 11/16/18  1958    History   Chief Complaint Chief Complaint  Patient presents with  . Medical Clearance    pt re questing detoxic  . Detoxic ETOH    HPI Randall Simpson is a 53 y.o. male.     HPI Patient presents to the emergency room for evaluation of alcohol abuse and depression.  She states he has a history of alcohol abuse.  He has been drinking regularly.  He has been feeling depressed with no overt plans to commit suicide.  The patient felt worse and knows that he needs to stop drinking.  In the past however he has had trouble with seizures and withdrawal.  He went to behavioral health today.  He had a brief evaluation there and they sent him to the ED for medical clearance.  Patient currently denies any trouble with fevers.  No vomiting diarrhea.  No chest pain or shortness of breath. Past Medical History:  Diagnosis Date  . Atrial fibrillation (HCC)   . Carotid artery disease (HCC)   . DVT (deep venous thrombosis) (HCC)   . ETOH abuse   . MI (myocardial infarction) (HCC) 06/02/2017   [place]    Patient Active Problem List   Diagnosis Date Noted  . Single subsegmental pulmonary embolism without acute cor pulmonale 09/02/2018  . Pure hypercholesterolemia 09/02/2018  . Alcohol withdrawal (HCC) 08/14/2018  . DVT (deep venous thrombosis) (HCC) 08/12/2018  . Alcohol abuse 08/12/2018  . Tobacco abuse 08/12/2018  . Coronary artery disease 08/03/2018  . Alcohol withdrawal syndrome, with delirium (HCC) 08/03/2018  . History of MI (myocardial infarction) 06/02/2017    Past Surgical History:  Procedure Laterality Date  . SHOULDER SURGERY          Home Medications    Prior to Admission medications   Medication Sig Start Date End Date Taking? Authorizing Provider  acetaminophen (TYLENOL) 500 MG tablet Take 1,000 mg by mouth every 6 (six) hours as needed for mild  pain, moderate pain or headache.    [provider]  albuterol (PROVENTIL HFA;VENTOLIN HFA) 108 (90 Base) MCG/ACT inhaler Inhale 2 puffs into the lungs every 6 (six) hours as needed for wheezing or shortness of breath.    [provider]  apixaban (ELIQUIS) 5 MG TABS tablet Take two twice daily for 7 days then one twice daily 09/02/18   [name], MD  atorvastatin (LIPITOR) 20 MG tablet Take 1 tablet (20 mg total) by mouth daily. 09/03/18   [name], MD  chlordiazePOXIDE (LIBRIUM) 25 MG capsule 50mg  PO TID x 1D, then 25-50mg  PO BID X 1D, then 25-50mg  PO QD X 1D 09/02/18   [name], MD  folic acid (FOLVITE) 1 MG tablet Take 1 tablet (1 mg total) by mouth daily. 09/02/18   [name], MD  hydrOXYzine (ATARAX/VISTARIL) 10 MG tablet Take 1 tablet (10 mg total) by mouth 3 (three) times daily as needed for anxiety (use 2nd). Patient not taking: Reported on 09/02/2018 08/15/18   Jerald Kief, MD  Multiple Vitamin (MULTIVITAMIN WITH MINERALS) TABS tablet Take 1 tablet by mouth daily.    [provider]  pantoprazole (PROTONIX) 40 MG tablet Take 40 mg by mouth daily. 08/07/18   [provider]  polyvinyl alcohol (LIQUIFILM TEARS) 1.4 % ophthalmic solution Place 1 drop into both eyes as needed for dry eyes.    [provider]  thiamine 100 MG tablet Take 1 tablet (100 mg total) by mouth daily. 09/02/18   [name], MD    Family History Family History  Problem Relation Age of Onset  . Cancer Mother   . Cancer Father     Social History Social History   Tobacco Use  . Smoking status: Current Every Day Smoker    Packs/day: 0.50    Types: Cigarettes  . Smokeless tobacco: Never Used  Substance Use Topics  . Alcohol use: Yes    Comment: daily use-8 pack of wine and several pints of beer and vodka   . Drug use: Never     Allergies   Xarelto [rivaroxaban]   Review of Systems Review of Systems  All other systems reviewed  and are negative.    Physical Exam Updated Vital Signs BP 109/84   Pulse (!) 104   Temp 99.5 F (37.5 C) (Oral)   Resp 15   Ht 1.778 m ( )   Wt 74.8 kg   SpO2 98%   BMI 23.68 kg/m   Physical Exam Vitals signs and nursing note reviewed.  Constitutional:      General: He is not in acute distress.    Appearance: He is well-developed.  HENT:     Head: Normocephalic and atraumatic.     Right Ear: External ear normal.     Left Ear: External ear normal.  Eyes:     General: No scleral icterus.       Right eye: No discharge.        Left eye: No discharge.     Conjunctiva/sclera: Conjunctivae normal.  Neck:     Musculoskeletal: Neck supple.     Trachea: No tracheal deviation.  Cardiovascular:     Rate and Rhythm: Regular rhythm. Tachycardia present.  Pulmonary:     Effort: Pulmonary effort is normal. No respiratory distress.     Breath sounds: Normal breath sounds. No stridor. No wheezing or rales.  Abdominal:     General: Bowel sounds are normal. There is no distension.     Palpations: Abdomen is soft.     Tenderness: There is no abdominal tenderness. There is no guarding or rebound.  Musculoskeletal:        General: No tenderness.  Skin:    General: Skin is warm and dry.     Findings: No rash.  Neurological:     Mental Status: He is alert.     Cranial Nerves: No cranial nerve deficit (no facial droop, extraocular movements intact, no slurred speech).     Sensory: No sensory deficit.     Motor: No abnormal muscle tone or seizure activity.     Coordination: Coordination normal.      ED Treatments / Results  Labs (all labs ordered are listed, but only abnormal results are displayed) Labs Reviewed  COMPREHENSIVE METABOLIC PANEL - Abnormal; Notable for the following components:      Result Value   Sodium 133 (*)    Chloride 92 (*)    Glucose, Bld 117 (*)    Total Protein 8.4 (*)    AST 103 (*)    ALT 115 (*)    Total Bilirubin 1.5 (*)    All other  components within normal limits  ETHANOL - Abnormal; Notable for the following components:   Alcohol, Ethyl (B) 134 (*)    All other components within normal limits  CBC - Abnormal; Notable for the following components:  RBC 5.84 (*)    All other components within normal limits  RAPID URINE DRUG SCREEN, HOSP PERFORMED    EKG None  Radiology No results found.  Procedures Procedures (including critical care time)  Medications Ordered in ED Medications  LORazepam (ATIVAN) tablet 2 mg (has no administration in time range)  LORazepam (ATIVAN) injection 0-4 mg (has no administration in time range)    Or  LORazepam (ATIVAN) tablet 0-4 mg (has no administration in time range)  LORazepam (ATIVAN) injection 0-4 mg (has no administration in time range)    Or  LORazepam (ATIVAN) tablet 0-4 mg (has no administration in time range)  thiamine (VITAMIN B-1) tablet 100 mg (has no administration in time range)    Or  thiamine (B-1) injection 100 mg (has no administration in time range)     Initial Impression / Assessment and Plan / ED Course  I have reviewed the triage vital signs and the nursing notes.  Pertinent labs & imaging results that were available during my care of the patient were reviewed by me and considered in my medical decision making (see chart for details).   Patient has mild tachycardia.  No severe tremor.  Symptoms likely related to mild alcohol withdrawal.  Patient will be started on Ativan CIWA protocol.  I will consult with psychiatry.  Final Clinical Impressions(s) / ED Diagnoses   Final diagnoses:  Depression, unspecified depression type  Alcohol abuse    ED Discharge Orders    None       Linwood Dibbles, MD 11/16/18 2141

## 2018-11-16 NOTE — Progress Notes (Signed)
Per Nanine Means DNP, pt is to be transferred to [facility] for med clearance. Pt has hx of ETOH-related seizures, vomiting blood, and reportedly endorsed SOB. O2 sat 96%. Pt was calm, cooperative, and in no acute distress when transported via [facility]. [facility] charge RN notified.

## 2018-11-16 NOTE — ED Notes (Signed)
Bed: WA31 Expected date:  Expected time:  Means of arrival:  Comments: 

## 2018-11-16 NOTE — ED Notes (Signed)
Bed: WTR8 Expected date:  Expected time:  Means of arrival:  Comments: 

## 2018-11-16 NOTE — H&P (Signed)
Behavioral Health Medical Screening Exam  Randall Simpson is an 53 y.o. male.  Total Time spent with patient: 30 minutes  Psychiatric Specialty Exam: Physical Exam  ROS  Blood pressure (!) 116/97, pulse (!) 118, temperature 98.7 F (37.1 C), temperature source Oral, resp. rate 18, SpO2 96 %.There is no height or weight on file to calculate BMI.  General Appearance: Disheveled  Eye Contact:  Fair  Speech:  Normal Rate  Volume:  Decreased  Mood:  Anxious  Affect:  Congruent  Thought Process:  Coherent and Descriptions of Associations: Intact  Orientation:  Full (Time, Place, and Person)  Thought Content:  WDL and Logical  Suicidal Thoughts:  No  Homicidal Thoughts:  No  Memory:  Immediate;   Fair Recent;   Fair Remote;   Fair  Judgement:  Fair  Insight:  Fair  Psychomotor Activity:  Decreased  Concentration: Concentration: Fair and Attention Span: Fair  Recall:  [employer] of Knowledge:Fair  Language: Good  Akathisia:  No  Handed:  Right  AIMS (if indicated):     Assets:  Leisure Time Physical Health Resilience Social Support  Sleep:       Musculoskeletal: Strength & Muscle Tone: within normal limits Gait & Station: normal Patient leans: N/A  Blood pressure (!) 116/97, pulse (!) 118, temperature 98.7 F (37.1 C), temperature source Oral, resp. rate 18, SpO2 96 %.  Recommendations:  patient needs medical clearance due to history of seizures, elevated BH, and pulse  Nanine Means, NP 11/16/2018, 9:23 PM

## 2018-11-16 NOTE — ED Triage Notes (Signed)
Pt sent from [facility] by EMS for medical clearance after the pt went to [facility] for detoxi  For ETOH

## 2018-11-17 DIAGNOSIS — F1094 Alcohol use, unspecified with alcohol-induced mood disorder: Secondary | ICD-10-CM | POA: Diagnosis present

## 2018-11-17 MED ORDER — ONDANSETRON 4 MG PO TBDP
4.0000 mg | ORAL_TABLET | Freq: Once | ORAL | Status: AC
  Administered 2018-11-17: 01:00:00 4 mg via ORAL
  Filled 2018-11-17: qty 1

## 2018-11-17 NOTE — ED Notes (Signed)
TTS assessment with Lelon Mast in progress at present.

## 2018-11-17 NOTE — ED Notes (Signed)
Transported to [facility] by [employer].  All belongings returned to the [employer] for the pt. Pt was calm and cooperative.

## 2018-11-17 NOTE — [facility] Suicide Risk Assessment (Signed)
Suicide Risk Assessment  Discharge Assessment   [facility] Discharge Suicide Risk Assessment   Principal Problem: Alcohol use with alcohol-induced mood disorder [facility]) Discharge Diagnoses: Principal Problem:   Alcohol use with alcohol-induced mood disorder (HCC)  Patient presents lying in the bed and is alert and oriented.  Patient continues to deny any suicidal or homicidal ideations and denies any hallucinations.  Patient reports that he came in to try to get assistance with his alcohol abuse as he is about the sober living.  Patient reports that he has been drinking for approximately 45 years and that he usually drinks approximately 4-5 bottles of wine a day and sometimes will include a 12 pack of beer.  Patient reports that in the past that he has been to some severe withdrawals and has had seizures.  He states that there may be some depression that he deals with that is nothing significant.  He states that he is only wanted to go to a detox bed and states he does not feel that he needs to be admitted to the mental health Hospital.  Patient states that he would gladly accept outpatient resources or detox bed.  patient claims he was not able inpatient criteria and will be provided with outpatient resources.  Total Time spent with patient: 30 minutes  Musculoskeletal: Strength & Muscle Tone: within normal limits Gait & Station: normal Patient leans: N/A  Psychiatric Specialty Exam:   Blood pressure 123/89, pulse 89, temperature 98.1 F (36.7 C), temperature source Oral, resp. rate 20, height 5\' 10"  (1.778 m), weight 74.8 kg, SpO2 96 %.Body mass index is 23.68 kg/m.  General Appearance: Casual  Eye Contact::  Good  Speech:  Clear and Coherent and Normal Rate409  Volume:  Normal  Mood:  Euthymic  Affect:  Congruent  Thought Process:  Coherent and Descriptions of Associations: Intact  Orientation:  Full (Time, Place, and Person)  Thought Content:  WDL  Suicidal Thoughts:  No  Homicidal  Thoughts:  No  Memory:  Immediate;   Good Recent;   Good Remote;   Good  Judgement:  Fair  Insight:  Fair  Psychomotor Activity:  Normal  Concentration:  Good  Recall:  Good  Fund of Knowledge:Good  Language: Good  Akathisia:  No  Handed:  Right  AIMS (if indicated):     Assets:  Communication Skills Desire for Improvement Resilience Social Support Transportation  Sleep:     Cognition: WNL  ADL's:  Intact   Mental Status Per Nursing Assessment::   On Admission:   11/17/18 [facility] TTS Assessment: 53 y.o. male who was brought to [facility] by EMS after he attempted to be seen at [facility] but was required to be brought to the ED due to detox w/d symptoms from EtOH he'd experienced in the past, including vomiting blood, seizures, and endorsing SOB. Pt shared he has been feeling depressed after he began drinking alcohol last week on Saturday (Nov 07, 2018) and states he would like to go back to his sober living home but would like to get sober first. Pt denies SI, any prior SI, any attempts at killing himself, or any plans to kill himself. Pt denies HI, AVH, NSSIB, any involvement in the legal system, or any access to weapons/guns. Pt shares the only substance he's used has been his use with EtOH. Pt states he left the sober living facility due to wanting to engage in an activity and that the activity resulted in him drinking; he states that  he continued to drink and that he can't return to the home until he is again sober. Pt states that, prior to this relapse, he was sober for approximately 3 months  Demographic Factors:  Male, Caucasian, Low socioeconomic status, Living alone and Unemployed  Loss Factors: NA  Historical Factors: NA  Risk Reduction Factors:   NA  Continued Clinical Symptoms:  Alcohol/Substance Abuse/Dependencies  Cognitive Features That Contribute To Risk:  None    Suicide Risk:  Mild:  Suicidal ideation of limited frequency, intensity, duration, and  specificity.  There are no identifiable plans, no associated intent, mild dysphoria and related symptoms, good self-control (both objective and subjective assessment), few other risk factors, and identifiable protective factors, including available and accessible social support.    Plan Of Care/Follow-up recommendations:  Continue activity as tolerated. Continue diet as recommended by your PCP. Ensure to keep all appointments with outpatient providers. Follow up with outpatient resources. Possible detox bed at outpatient facility  Maryfrances Bunnell, FNP 11/17/2018, 10:33 AM

## 2018-11-17 NOTE — ED Notes (Signed)
Pt vomited x 1 in bathroom.

## 2018-11-17 NOTE — BH Assessment (Addendum)
Tele Assessment Note   Patient Name: Randall Simpson MRN: [id number] Referring Physician: Dr. Linwood DibblesJon Knapp, MD Location of Patient: [facility] Location of Provider: Behavioral Health TTS Department  Randall FlockRaymond Radilla is a 53 y.o. male who [facility] after [facility] but was required to be brought to the ED due to detox w/d symptoms from EtOH he'd experienced in the past, including vomiting blood, seizures, and endorsing SOB. Pt shared he has been feeling depressed after he began drinking alcohol last week on Saturday (Nov 07, 2018) and states he would like to go back to his sober living home but would like to get sober first. Pt denies SI, any prior SI, any attempts at killing himself, or any plans to kill himself. Pt denies HI, AVH, NSSIB, any involvement in the legal system, or any access to weapons/guns. Pt shares the only substance he's used has been his use with EtOH.  Pt states he left the sober living facility due to wanting to engage in an activity and that the activity resulted in him drinking; he states that he continued to drink and that he can't return to the home until he is again sober. Pt states that, prior to this relapse, he was sober for approximately 3 months.  Pt is oriented x4. His recent and remote memory is intact. Pt was cooperative throughout the assessment cycle. Pt's insight, judgement, and impulse control is impaired at this time.   Diagnosis: F10.20, Alcohol use disorder, Severe   Past Medical History:  Past Medical History:  Diagnosis Date  . Atrial fibrillation (HCC)   . Carotid artery disease (HCC)   . DVT (deep venous thrombosis) (HCC)   . ETOH abuse   . MI (myocardial infarction) (HCC) 06/02/2017   [place]    Past Surgical History:  Procedure Laterality Date  . SHOULDER SURGERY      Family History:  Family History  Problem Relation Age of Onset  . Cancer Mother   . Cancer Father     Social History:   reports that he has been smoking cigarettes. He has been smoking about 0.50 packs per day. He has never used smokeless tobacco. He reports current alcohol use. He reports that he does not use drugs.  Additional Social History:  Alcohol / Drug Use Pain Medications: Please see MAR Prescriptions: Please see MAR Over the Counter: Please see MAR History of alcohol / drug use?: Yes Longest period of sobriety (when/how long): 3 months Substance #1 Name of Substance 1: EtOH 1 - Age of First Use: Unknown 1 - Amount (size/oz): 3 bottles of wine & a 12-pack of beer 1 - Frequency: Daily 1 - Duration: Daily since 11/07/2018 1 - Last Use / Amount: Today (11/16/2018)  CIWA: CIWA-Ar BP: 123/89 Pulse Rate: 89 Nausea and Vomiting: mild nausea with no vomiting Tactile Disturbances: very mild itching, pins and needles, burning or numbness Tremor: two Auditory Disturbances: very mild harshness or ability to frighten Paroxysmal Sweats: two Visual Disturbances: very mild sensitivity Anxiety: two Headache, Fullness in Head: none present Agitation: somewhat more than normal activity Orientation and Clouding of Sensorium: oriented and can do serial additions CIWA-Ar Total: 11 COWS:    Allergies:  Allergies  Allergen Reactions  . Xarelto [Rivaroxaban] Other (See Comments)    Difficulty swallowing    Home Medications: (Not in a hospital admission)   OB/GYN Status:  No LMP for male patient.  General Assessment Data  Location of Assessment: [facility] TTS Assessment: In system Is this a Tele or Face-to-Face Assessment?: Tele Assessment Is this an Initial Assessment or a Re-assessment for this encounter?: Initial Assessment Patient Accompanied by:: N/A Language Other than English: No Living Arrangements: In Group Home: (Comment: Name of Group Home)(Sober Facility) What gender do you identify as?: Male Marital status: Single Maiden name: Whittlesey Pregnancy Status: No Living Arrangements:  Non-relatives/Friends Can pt return to current living arrangement?: Yes Admission Status: Voluntary Is patient capable of signing voluntary admission?: Yes Referral Source: Self/Family/Friend Insurance type: BCBS     Crisis Care Plan Living Arrangements: Non-relatives/Friends Legal Guardian: Other:(N/A) Name of Psychiatrist: None Name of Therapist: None  Education Status Is patient currently in school?: No Is the patient employed, unemployed or receiving disability?: Unemployed  Risk to self with the past 6 months Suicidal Ideation: No Has patient been a risk to self within the past 6 months prior to admission? : No Suicidal Intent: No Has patient had any suicidal intent within the past 6 months prior to admission? : No Is patient at risk for suicide?: No Suicidal Plan?: No Has patient had any suicidal plan within the past 6 months prior to admission? : No Access to Means: No What has been your use of drugs/alcohol within the last 12 months?: Pt acknowledges EtOH use Previous Attempts/Gestures: No How many times?: 0 Other Self Harm Risks: Pt uses EtOH and throws up blood and has seizures from it Triggers for Past Attempts: None known Intentional Self Injurious Behavior: None Family Suicide History: No Recent stressful life event(s): Other (Comment)(Relapse on EtOH) Persecutory voices/beliefs?: No Depression: No Depression Symptoms: Guilt, Feeling worthless/self pity Substance abuse history and/or treatment for substance abuse?: Yes Suicide prevention information given to non-admitted patients: Not applicable  Risk to Others within the past 6 months Homicidal Ideation: No Does patient have any lifetime risk of violence toward others beyond the six months prior to admission? : No Thoughts of Harm to Others: No Current Homicidal Intent: No Current Homicidal Plan: No Access to Homicidal Means: No Identified Victim: None noted History of harm to others?: No Assessment of  Violence: On admission Violent Behavior Description: None noted Does patient have access to weapons?: No(Pt denies access to guns/weapons) Criminal Charges Pending?: No Does patient have a court date: No Is patient on probation?: No  Psychosis Hallucinations: None noted Delusions: None noted  Mental Status Report Appearance/Hygiene: In scrubs Eye Contact: Good Motor Activity: Freedom of movement(Pt was sitting up in his hospital bed) Speech: Logical/coherent Level of Consciousness: Alert Mood: Sullen Affect: Appropriate to circumstance Anxiety Level: Minimal Thought Processes: Coherent, Relevant Judgement: Partial Orientation: Person, Place, Time, Situation Obsessive Compulsive Thoughts/Behaviors: None  Cognitive Functioning Concentration: Normal Memory: Recent Intact, Remote Intact Is patient IDD: No Insight: Good Impulse Control: Poor Appetite: Fair Have you had any weight changes? : No Change Sleep: Decreased Total Hours of Sleep: 8(Sleep is of poor quality) Vegetative Symptoms: None  ADLScreening [facility] Assessment Services) Patient's cognitive ability adequate to safely complete daily activities?: No Patient able to express need for assistance with ADLs?: Yes Independently performs ADLs?: Yes (appropriate for developmental age)  Prior Inpatient Therapy Prior Inpatient Therapy: Yes Prior Therapy Dates: Multiple times (7-6 times) Prior Therapy Facilty/Provider(s): Multiple Reason for Treatment: SA  Prior Outpatient Therapy Prior Outpatient Therapy: No Does patient have an ACCT team?: No Does patient have Intensive In-House Services?  : No Does patient have [facility] services? : No Does patient have P4CC services?: No  ADL Screening (condition at time of admission) Patient's cognitive ability adequate to safely complete daily activities?: No Is the patient deaf or have difficulty hearing?: No Does the patient have difficulty seeing, even when wearing  glasses/contacts?: No Does the patient have difficulty concentrating, remembering, or making decisions?: Yes Patient able to express need for assistance with ADLs?: Yes Does the patient have difficulty dressing or bathing?: No Independently performs ADLs?: Yes (appropriate for developmental age) Does the patient have difficulty walking or climbing stairs?: No Weakness of Legs: None Weakness of Arms/Hands: None  Home Assistive Devices/Equipment Home Assistive Devices/Equipment: Eyeglasses  Therapy Consults (therapy consults require a physician order) PT Evaluation Needed: No OT Evalulation Needed: No SLP Evaluation Needed: No Abuse/Neglect Assessment (Assessment to be complete while patient is alone) Abuse/Neglect Assessment Can Be Completed: Yes Physical Abuse: Denies Verbal Abuse: Denies Sexual Abuse: Denies Exploitation of patient/patient's resources: Denies Self-Neglect: Denies Values / Beliefs Spiritual Requests During Hospitalization: None Consults Spiritual Care Consult Needed: No Social Work Consult Needed: No Merchant navy officer (For Healthcare) Does Patient Have a Medical Advance Directive?: No Would patient like information on creating a medical advance directive?: No - Patient declined        Disposition:  Disposition Initial Assessment Completed for this Encounter: Yes  This service was provided via telemedicine using a 2-way, interactive audio and video technology.  Names of all persons participating in this telemedicine service and their role in this encounter. Name: Dereck Neenan Role: Patient  Name: Duard Brady Role: Clinician    Ralph Dowdy 11/17/2018 3:25 AM

## 2018-11-17 NOTE — BH Assessment (Signed)
BHH Assessment Progress Note  Per Reola Calkins, NP, this pt would benefit from admission to a facility for detoxification at this time.  The following facilities have been contacted to seek placement for this pt, with results as noted:  Beds available, information sent, decision pending:  [facility], [place] [employer] Health Coordinator [phone]

## 2018-11-17 NOTE — ED Notes (Signed)
As per TTS, pt pending OBS Unit at [facility].

## 2018-11-17 NOTE — ED Notes (Signed)
Called report: No answer and voice mail disabled for that extension.

## 2018-11-17 NOTE — BH Assessment (Signed)
[facility] Assessment Progress Note  Per Juanetta Beets, DO, this pt would benefit from admission to a facility for detoxification at this time.  At 11:22 Kinnari calls from [facility].  Pt has been accepted to their facility by Dr Daphane Shepherd to [facility] unit.  Dr Sharma Covert, concurs with this disposition, as does the pt who is currently under voluntary status.  Pt's nurse, Kendal Hymen, has been notified, and agrees to call report to [phone].  Pt is to be transported via Leota Sauers, MA Behavioral Health Coordinator [phone]

## 2018-11-17 NOTE — ED Notes (Signed)
Special educational needs teacher for transport to [employer].

## 2018-12-02 ENCOUNTER — Encounter (HOSPITAL_COMMUNITY): Payer: Self-pay

## 2018-12-02 ENCOUNTER — Ambulatory Visit (INDEPENDENT_AMBULATORY_CARE_PROVIDER_SITE_OTHER)
Admission: RE | Admit: 2018-12-02 | Discharge: 2018-12-02 | Disposition: A | Discharge status: Home / Self Care | Payer: BC Managed Care – PPO | Source: Ambulatory Visit

## 2018-12-02 ENCOUNTER — Other Ambulatory Visit: Payer: Self-pay

## 2018-12-02 ENCOUNTER — Emergency Department (HOSPITAL_COMMUNITY)
Admission: EM | Admit: 2018-12-02 | Discharge: 2018-12-02 | Disposition: A | Discharge status: Home / Self Care | Payer: BC Managed Care – PPO | Attending: Emergency Medicine | Admitting: Emergency Medicine

## 2018-12-02 ENCOUNTER — Telehealth: Payer: BLUE CROSS/BLUE SHIELD

## 2018-12-02 DIAGNOSIS — M79661 Pain in right lower leg: Secondary | ICD-10-CM

## 2018-12-02 DIAGNOSIS — F17210 Nicotine dependence, cigarettes, uncomplicated: Secondary | ICD-10-CM | POA: Insufficient documentation

## 2018-12-02 DIAGNOSIS — R0602 Shortness of breath: Secondary | ICD-10-CM | POA: Diagnosis not present

## 2018-12-02 DIAGNOSIS — Z7982 Long term (current) use of aspirin: Secondary | ICD-10-CM | POA: Insufficient documentation

## 2018-12-02 DIAGNOSIS — M79662 Pain in left lower leg: Secondary | ICD-10-CM | POA: Diagnosis not present

## 2018-12-02 DIAGNOSIS — R05 Cough: Secondary | ICD-10-CM | POA: Diagnosis not present

## 2018-12-02 DIAGNOSIS — I252 Old myocardial infarction: Secondary | ICD-10-CM | POA: Insufficient documentation

## 2018-12-02 DIAGNOSIS — R600 Localized edema: Secondary | ICD-10-CM | POA: Diagnosis not present

## 2018-12-02 DIAGNOSIS — R2243 Localized swelling, mass and lump, lower limb, bilateral: Secondary | ICD-10-CM | POA: Diagnosis present

## 2018-12-02 LAB — CBC WITH DIFFERENTIAL/PLATELET
Abs Immature Granulocytes: 0.01 10*3/uL (ref 0.00–0.07)
Basophils Absolute: 0 10*3/uL (ref 0.0–0.1)
Basophils Relative: 0 %
Eosinophils Absolute: 0.1 10*3/uL (ref 0.0–0.5)
Eosinophils Relative: 2 %
HCT: 38.1 % — ABNORMAL LOW (ref 39.0–52.0)
Hemoglobin: 12 g/dL — ABNORMAL LOW (ref 13.0–17.0)
Immature Granulocytes: 0 %
Lymphocytes Relative: 41 %
Lymphs Abs: 2.2 10*3/uL (ref 0.7–4.0)
MCH: 27.2 pg (ref 26.0–34.0)
MCHC: 31.5 g/dL (ref 30.0–36.0)
MCV: 86.4 fL (ref 80.0–100.0)
Monocytes Absolute: 0.5 10*3/uL (ref 0.1–1.0)
Monocytes Relative: 9 %
Neutro Abs: 2.6 10*3/uL (ref 1.7–7.7)
Neutrophils Relative %: 48 %
Platelets: 294 10*3/uL (ref 150–400)
RBC: 4.41 MIL/uL (ref 4.22–5.81)
RDW: 16 % — ABNORMAL HIGH (ref 11.5–15.5)
WBC: 5.5 10*3/uL (ref 4.0–10.5)
nRBC: 0 % (ref 0.0–0.2)

## 2018-12-02 LAB — COMPREHENSIVE METABOLIC PANEL
ALT: 39 U/L (ref 0–44)
AST: 23 U/L (ref 15–41)
Albumin: 3.6 g/dL (ref 3.5–5.0)
Alkaline Phosphatase: 67 U/L (ref 38–126)
Anion gap: 7 (ref 5–15)
BUN: 14 mg/dL (ref 6–20)
CO2: 23 mmol/L (ref 22–32)
Calcium: 8.7 mg/dL — ABNORMAL LOW (ref 8.9–10.3)
Chloride: 107 mmol/L (ref 98–111)
Creatinine, Ser: 1 mg/dL (ref 0.61–1.24)
GFR calc Af Amer: >60 mL/min (ref >60)
GFR calc non Af Amer: >60 mL/min (ref >60)
Glucose, Bld: 94 mg/dL (ref 70–99)
Potassium: 3.8 mmol/L (ref 3.5–5.1)
Sodium: 137 mmol/L (ref 135–145)
Total Bilirubin: 0.2 mg/dL — ABNORMAL LOW (ref 0.3–1.2)
Total Protein: 6.4 g/dL — ABNORMAL LOW (ref 6.5–8.1)

## 2018-12-02 LAB — BRAIN NATRIURETIC PEPTIDE: B Natriuretic Peptide: 57.5 pg/mL (ref 0.0–100.0)

## 2018-12-02 LAB — D-DIMER, QUANTITATIVE: D-Dimer, Quant: <0.27 ug/mL-FEU (ref 0.00–0.50)

## 2018-12-02 LAB — TROPONIN I: Troponin I: <0.03 ng/mL (ref <0.03)

## 2018-12-02 MED ORDER — APIXABAN 5 MG PO TABS: 5.0000 mg | ORAL_TABLET | Freq: Two times a day (BID) | ORAL | 0 refills | Status: DC

## 2018-12-02 NOTE — ED Provider Notes (Addendum)
[facility]-EMERGENCY DEPT Provider Note   CSN: [id number] Arrival date & time: 12/02/18  1923    History   Chief Complaint Chief Complaint  Patient presents with  . Joint Swelling    HPI Randall Simpson is a 53 y.o. 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 Onset 1 week ago.  Worse when he walks. Had urgent care telemedicine visit and advised to come to ER.  Non compliant with eloquis due to cost for the last week.  States [facility] and states he didn't get up or walk around too much during hospitalization.  Reports having chronic, pleuritic right sided chest pain ever since February when he was diagnosed with right PE, but states this is unchanged.  Chronic cough and SOB that he attributes to his smoking and COPD, that resolve with albuterol inhaler.  No interventions. No alleviating factors.   Denies fever, chills, congestion, sore throat, exertional CP or SOB, orthopnea, PND.  No h/o HF. Last ETOH 2 weeks ago.     HPI  Past Medical History:  Diagnosis Date  . Atrial fibrillation (HCC)   . Carotid artery disease (HCC)   . DVT (deep venous thrombosis) (HCC)   . ETOH abuse   . MI (myocardial infarction) (HCC) 06/02/2017   [place]    Patient Active Problem List   Diagnosis Date Noted  . Alcohol use with alcohol-induced mood disorder (HCC) 11/17/2018  . Single subsegmental pulmonary embolism without acute cor pulmonale 09/02/2018  . Pure hypercholesterolemia 09/02/2018  . Alcohol withdrawal (HCC) 08/14/2018  . DVT (deep venous thrombosis) (HCC) 08/12/2018  . Alcohol abuse 08/12/2018  . Tobacco abuse 08/12/2018  . Coronary artery disease 08/03/2018  . Alcohol withdrawal syndrome, with delirium (HCC) 08/03/2018   . History of MI (myocardial infarction) 06/02/2017    Past Surgical History:  Procedure Laterality Date  . SHOULDER SURGERY          Home Medications    Prior to Admission medications   Medication Sig Start Date End Date Taking? Authorizing Provider  albuterol (PROVENTIL HFA;VENTOLIN HFA) 108 (90 Base) MCG/ACT inhaler Inhale 2 puffs into the lungs every 6 (six) hours as needed for wheezing or shortness of breath.    [provider]  apixaban (ELIQUIS) 5 MG TABS tablet Take 1 tablet (5 mg total) by mouth 2 (two) times daily for 30 days. Take two twice daily for 7 days then one twice daily 12/02/18 01/01/19  Liberty HandyGibbons,  J, PA-C  aspirin EC 81 MG tablet Take 81 mg by mouth daily.    [provider]  atorvastatin (LIPITOR) 20 MG tablet Take 1 tablet (20 mg total) by mouth daily. Patient not taking: Reported on 11/17/2018 09/03/18   Storm FriskWright, Patrick E, MD  folic acid (FOLVITE) 1 MG tablet Take 1 tablet (1 mg total) by mouth daily. Patient not taking: Reported on 11/17/2018 09/02/18   Storm FriskWright, Patrick E, MD  thiamine 100 MG tablet Take 1 tablet (100 mg total) by mouth daily. Patient not taking: Reported on 11/17/2018 09/02/18   Storm FriskWright, Patrick E, MD    Family History Family History  Problem Relation Age of Onset  . Cancer Mother   . Cancer Father     Social History Social History  Tobacco Use  . Smoking status: Current Every Day Smoker    Packs/day: 0.50    Types: Cigarettes  . Smokeless tobacco: Never Used  Substance Use Topics  . Alcohol use: Yes    Comment: daily use-8 pack of wine and several pints of beer and vodka   . Drug use: Never     Allergies   Xarelto [rivaroxaban]   Review of Systems Review of Systems  Cardiovascular: Positive for leg swelling (calf pain bilaterally).  Neurological:       Feet/ankle paresthesias   All other systems reviewed and are negative.    Physical Exam Updated Vital Signs BP 115/81 (BP Location: Left Arm)   Pulse  67   Temp 98.1 F (36.7 C) (Oral)   Resp 14   Ht 5\' 10"  (1.778 m)   Wt 72.6 kg   SpO2 100%   BMI 22.96 kg/m   Physical Exam Vitals signs and nursing note reviewed.  Constitutional:      Appearance: He is well-developed.     Comments: Non toxic.  HENT:     Head: Normocephalic and atraumatic.     Nose: Nose normal.  Eyes:     Conjunctiva/sclera: Conjunctivae normal.  Neck:     Musculoskeletal: Normal range of motion.  Cardiovascular:     Rate and Rhythm: Normal rate and regular rhythm.     Heart sounds: Normal heart sounds.     Comments: Bilateral calf tenderness, without edema, erythema, warmth. 1+ radial and DP pulses bilaterally. No obvious ankle or peripheral edema.  Pulmonary:     Effort: Pulmonary effort is normal.     Breath sounds: Normal breath sounds.  Musculoskeletal: Normal range of motion.  Skin:    General: Skin is warm and dry.     Capillary Refill: Capillary refill takes less than 2 seconds.  Neurological:     Mental Status: He is alert.     Comments: Sensation and strength intact in lower extremities   Psychiatric:        Behavior: Behavior normal.      ED Treatments / Results  Labs (all labs ordered are listed, but only abnormal results are displayed) Labs Reviewed  CBC WITH DIFFERENTIAL/PLATELET - Abnormal; Notable for the following components:      Result Value   Hemoglobin 12.0 (*)    HCT 38.1 (*)    RDW 16.0 (*)    All other components within normal limits  COMPREHENSIVE METABOLIC PANEL - Abnormal; Notable for the following components:   Calcium 8.7 (*)    Total Protein 6.4 (*)    Total Bilirubin 0.2 (*)    All other components within normal limits  BRAIN NATRIURETIC PEPTIDE  D-DIMER, QUANTITATIVE (NOT AT [facility])  TROPONIN I    EKG EKG Interpretation  Date/Time:  Wednesday December 02 2018 23:07:27 EDT Ventricular Rate:  61 PR Interval:    QRS Duration: 92 QT Interval:  393 QTC Calculation: 396 R Axis:   32 Text Interpretation:   Sinus rhythm No significant change since last tracing Confirmed by Drema Pry [phone]) on 12/02/2018 11:20:56 PM   Radiology No results found.  Procedures Procedures (including critical care time)  Medications Ordered in ED Medications - No data to display   Initial Impression / Assessment and Plan / ED Course  I have reviewed the triage vital signs and the nursing notes.  Pertinent labs & imaging results that were available during my care of the patient were reviewed by me and considered  in my medical decision making (see chart for details).       53 yo with bilateral calf pain, subjective ankle swelling although none apparent on my exam, feet paresthesias. H/o L DVT, R PE and non compliance with eloquis due to cost for 1 week.  Reports recent hospitalization 1 week ago technically putting him at risk for developing DVT.  However, his exam is very benign today - no objective ankle edema, signs of hypervolemia.  Extremities are NVI. Additionall reports unchanged chronic right pleuritic chest pain since diagnosis of PE and occasional SOB, cough from COPD but no frank new or exertional CP, SOB, orthopnea, PND. No tachycardia, tachypnea, hypoxemia and PE is considered very unlikely. Lower suspicion for ACS, HF, infectious process in lungs.  Unfortunately, no vascular US in ER tonight. Will obtain screening labs.  Will add d-dimer as well although he's not reporting PE symptoms, CP, SOB, no tachypnea, tachycardia or hypoxemia. If d-dimer elevated, consider CTA.  Final Clinical Impressions(s) / ED Diagnoses   Labs vastly reassuring including d-dimer, BNP, creatinine, hepatic function. EKG without signs of PE, RV strain. Discussed results with patient and re-evaluated it. Continues to denies pleuritic or exertional CP, SOB.  Given benign work up, exam, deemed appropriate for dc with eloquis. Case management consulted to assist with affording eloquis. Return precautions given. [facility]. Discussed with EDP. Final diagnoses:  Bilateral calf pain    ED Discharge Orders         Ordered    apixaban (ELIQUIS) 5 MG TABS tablet  2 times daily,   Status:  Discontinued     12/02/18 2259    apixaban (ELIQUIS) 5 MG TABS tablet  2 times daily     12/02/18 2300             Jerrell Mylar 12/03/18 1004    Gerhard Munch, MD 12/03/18 2212

## 2018-12-02 NOTE — Progress Notes (Addendum)
Consult request has been received. CSW attempting to follow up at present time.  Per EPD pt needs assistance with meds.  CSW deferred to [facility] RN CM who will speak to EPD after reviewing chart.  Dorothe Pea. Alvena Kiernan, LCSW, LCAS, CSI Transitions of Care Clinical Social Worker Care Coordination Department Ph: [phone]

## 2018-12-02 NOTE — Progress Notes (Addendum)
CSW received a call from the [facility] RN CM covering from [facility] who stated a message has been sent to the pt's provider at the [facility] and [facility] where pt received his prescriptions stating that the pt needs the medication and cannot afford it.  Per the [facility] RN CM, the [facility] ED EPD will put in an order for the pt's meds that are needed and pt will need to follow up with the [facility] tomorrow (12/03/18) to ask when the prescription renewal will be ready.  CSW updated pt who was appreciative and thanked the CSW.  RN updatd and RN will update the EDP.  CSW will continue to follow for D/C needs.  Please reconsult if future social work needs arise.  CSW signing off, as social work intervention is no longer needed.  Dorothe Pea. Colleene Swarthout, LCSW, LCAS, CSI Transitions of Care Clinical Social Worker Care Coordination Department Ph: [phone]

## 2018-12-02 NOTE — ED Notes (Signed)
EKG given to EDP,Cardama,MD., for review. 

## 2018-12-02 NOTE — ED Provider Notes (Signed)
Virtual Visit via Video Note: Video had difficulty connecting, visit was done via audio on phone.  Randall Simpson  initiated request for Telemedicine visit with [facility] Urgent Care team. I connected with Randall Simpson  on 12/02/2018 at 6:58 PM  for a synchronized telemedicine visit using a video enabled HIPPA compliant telemedicine application. I verified that I am speaking with Randall Simpson  using two identifiers.  C , PA-C  was physically located in a [facility] Urgent care site and Mortimer Parma was located at a different location.   The limitations of evaluation and management by telemedicine as well as the availability of in-person appointments were discussed. Patient was informed that he  may incur a bill ( including co-pay) for this virtual visit encounter. Randall Simpson  expressed understanding and gave verbal consent to proceed with virtual visit.     History of Present Illness:Randall Simpson  is a 53 y.o. male history of A. fib, CAD, previous DVT, alcohol use presents with concern for possible DVT.  Patient states that over the past week he has had worsening swelling to bilateral ankles.  He states that today it was significantly worsened.  States that swelling is extending into his calves.  Has pain in his calves with walking.  He recently had DVT in his left leg in February and was started on Eliquis.  He states that he recently stopped taking this last week as he was having difficulty affording this medicine.  Since he has noticed his symptoms develop.  Denies chest pain or shortness of breath.  Past Medical History:  Diagnosis Date  . Atrial fibrillation (HCC)   . Carotid artery disease (HCC)   . DVT (deep venous thrombosis) (HCC)   . ETOH abuse   . MI (myocardial infarction) (HCC) 06/02/2017   [place]    Allergies  Allergen Reactions  . Xarelto [Rivaroxaban] Other (See Comments)    Difficulty swallowing        Observations/Objective: Physical Exam   Constitutional: He is oriented to person, place, and time and well-developed, well-nourished, and in no distress. No distress.  Pulmonary/Chest: Effort normal.  Speaking in full sentences  Neurological: He is alert and oriented to person, place, and time.    Assessment and Plan: Given patient's history, recent cessation of his anticoagulation and smoking history recommended for patient to be evaluated in emergency room for rule out DVT.  Outside of time constraints for ordering outpatient ultrasounds.  Feel patient is high enough risk that evaluation should not be delayed.  Patient verbalized understanding.  Follow Up Instructions:    I discussed the assessment and treatment plan with the patient. The patient was provided an opportunity to ask questions and all were answered. The patient agreed with the plan and demonstrated an understanding of the instructions.   The patient was advised to call back or seek an in-person evaluation if the symptoms worsen or if the condition fails to improve as anticipated.     Lew Dawes, PA-C  12/02/2018 6:58 PM         Lew Dawes, PA-C 12/02/18 1906

## 2018-12-02 NOTE — ED Triage Notes (Signed)
Pt complains of bilateral ankle swelling for one week, they started to hurt today and he had a virtual visit snd was told to come here for evaluation. Pt has hx of blood clots

## 2018-12-02 NOTE — Discharge Instructions (Signed)
Please be evaluated in emergency room for rule out of DVT

## 2018-12-02 NOTE — Discharge Instructions (Addendum)
You were seen in the ER for calf pain, ankle swelling.   Labs were normal.   Based on this, the likelihood that you have a blood clot is very low.   The treatment of a clot is Eloquis, so you need to re-start this.   I have sent prescription for pharmacy at [facility].  Case Management nurse Burna Mortimer) has messaged the pharmacist to help you refill this. Go to pick it up tomorrow.   Call [facility] to make a follow up appointment in 1 week for re-evaluation.   Return for worsening leg swelling, asymmetric leg swelling, redness, warmth, chest pain or shortness of breath

## 2018-12-02 NOTE — Care Management (Signed)
ED CM received call from [facility] ED CSW concerning medication assistance consult for Eiquis. ED CM reviewed patient's chart, patient is active with the [facility] and their pharmacy. CM noted prescription with 3 refills back on 09/02/18. CM will notify the [facility] Pharmacist to assist patient with obtaining medications.  ED CM updated Janan Ridge PA and asked to send refill prescription to the [facility] pharmacy, and instructed patient to contact the [facility] tomorrow.

## 2018-12-03 ENCOUNTER — Other Ambulatory Visit: Payer: Self-pay

## 2018-12-03 ENCOUNTER — Encounter (HOSPITAL_COMMUNITY): Payer: Self-pay

## 2018-12-03 ENCOUNTER — Emergency Department (HOSPITAL_COMMUNITY)
Admission: EM | Admit: 2018-12-03 | Discharge: 2018-12-03 | Disposition: A | Discharge status: Home / Self Care | Payer: BC Managed Care – PPO | Attending: Emergency Medicine | Admitting: Emergency Medicine

## 2018-12-03 DIAGNOSIS — Z7901 Long term (current) use of anticoagulants: Secondary | ICD-10-CM | POA: Insufficient documentation

## 2018-12-03 DIAGNOSIS — Z86718 Personal history of other venous thrombosis and embolism: Secondary | ICD-10-CM | POA: Diagnosis not present

## 2018-12-03 DIAGNOSIS — R600 Localized edema: Secondary | ICD-10-CM | POA: Diagnosis not present

## 2018-12-03 DIAGNOSIS — F17210 Nicotine dependence, cigarettes, uncomplicated: Secondary | ICD-10-CM | POA: Insufficient documentation

## 2018-12-03 DIAGNOSIS — I252 Old myocardial infarction: Secondary | ICD-10-CM | POA: Diagnosis not present

## 2018-12-03 DIAGNOSIS — Z79899 Other long term (current) drug therapy: Secondary | ICD-10-CM | POA: Diagnosis not present

## 2018-12-03 DIAGNOSIS — Z7982 Long term (current) use of aspirin: Secondary | ICD-10-CM | POA: Insufficient documentation

## 2018-12-03 DIAGNOSIS — I2510 Atherosclerotic heart disease of native coronary artery without angina pectoris: Secondary | ICD-10-CM | POA: Insufficient documentation

## 2018-12-03 DIAGNOSIS — R2243 Localized swelling, mass and lump, lower limb, bilateral: Secondary | ICD-10-CM | POA: Diagnosis present

## 2018-12-03 MED FILL — ELIQUIS 5 MG TABLET: 5 | 30 days supply | Qty: 60 | Fill #0

## 2018-12-03 NOTE — ED Triage Notes (Signed)
Patient c/o bilateral leg swelling x 1 week. Patient reports that he was taking Eliquis, but has not taken  X 1 week due to cost. Patient states he began taking today after filling this AM

## 2018-12-03 NOTE — ED Provider Notes (Signed)
[facility]-EMERGENCY DEPT Provider Note   CSN: [id number] Arrival date & time: 12/03/18  1206    History   Chief Complaint Chief Complaint  Patient presents with  . Leg Swelling    HPI Randall Simpson is a 53 y.o. male.     The history is provided by the patient and medical records. No language interpreter was used.   Randall FlockRaymond Mccleave is a 53 y.o. male who presents to the Emergency Department complaining of leg swelling. He presents to the emergency department for evaluation of bilateral lower extremity edema that is been present for the last week. He describes swelling and discomfort to both legs. He does have a history of DVT as well as PE and is currently prescribed eloquence. He has been out of his Eliquis for a week or more and just got his refill the medication this morning and took his first dose. He denies any fevers, chest pain, shortness of breath, nausea, vomiting, abdominal pain. He presents to the emergency department because he is concerned that there might be another blood clot in his legs.  He did begin taking gabapentin a few weeks ago. 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 Past Medical History:  Diagnosis Date  . Atrial fibrillation (HCC)   . Carotid artery disease (HCC)   . DVT (deep venous thrombosis) (HCC)   . ETOH abuse   . MI (myocardial infarction) (HCC) 06/02/2017   [place]    Patient Active Problem List   Diagnosis Date Noted  . Alcohol use with alcohol-induced mood disorder (HCC) 11/17/2018  . Single subsegmental pulmonary embolism without acute cor pulmonale 09/02/2018  . Pure hypercholesterolemia 09/02/2018  . Alcohol withdrawal (HCC) 08/14/2018  . DVT (deep venous thrombosis) (HCC) 08/12/2018  . Alcohol abuse 08/12/2018  . Tobacco abuse 08/12/2018  . Coronary artery disease 08/03/2018  . Alcohol withdrawal syndrome, with delirium (HCC) 08/03/2018  . History of MI (myocardial infarction) 06/02/2017     Past Surgical History:  Procedure Laterality Date  . SHOULDER SURGERY          Home Medications    Prior to Admission medications   Medication Sig Start Date End Date Taking? Authorizing Provider  acetaminophen (TYLENOL) 325 MG tablet Take 650 mg by mouth daily.   Yes [provider]  albuterol (PROVENTIL HFA;VENTOLIN HFA) 108 (90 Base) MCG/ACT inhaler Inhale 2 puffs into the lungs every 6 (six) hours as needed for wheezing or shortness of breath.   Yes [provider]  apixaban (ELIQUIS) 5 MG TABS tablet Take 1 tablet (5 mg total) by mouth 2 (two) times daily for 30 days. Take two twice daily for 7 days then one twice daily 12/02/18 01/01/19 Yes Liberty HandyGibbons, Claudia J, PA-C  aspirin 81 MG chewable tablet Chew by mouth daily.   Yes [provider]  COENZYME Q10 PO Take 1 capsule by mouth daily.   Yes [provider]  gabapentin (NEURONTIN) 400 MG capsule Take 400 mg by mouth 3 (three) times daily as needed (For anxiety or restlessness.).  11/23/18  Yes [provider]  GARLIC PO Take 1 capsule by mouth daily.   Yes [provider]  Multiple Vitamin (MULTIVITAMIN WITH MINERALS) TABS tablet Take 1 tablet by mouth daily.   Yes [provider]  traZODone (DESYREL) 100 MG tablet Take 100 mg by mouth at bedtime as needed for sleep.  11/23/18  Yes [provider]  atorvastatin (LIPITOR) 20  MG tablet Take 1 tablet (20 mg total) by mouth daily. Patient not taking: Reported on 11/17/2018 09/03/18   [name], MD    Family History Family History  Problem Relation Age of Onset  . Cancer Mother   . Cancer Father     Social History Social History   Tobacco Use  . Smoking status: Current Every Day Smoker    Packs/day: 0.50    Types: Cigarettes  . Smokeless tobacco: Never Used  Substance Use Topics  . Alcohol use: Yes    Comment: daily use-8 pack of wine and several pints of beer and vodka   . Drug use: Never      Allergies   Xarelto [rivaroxaban]   Review of Systems Review of Systems  All other systems reviewed and are negative.    Physical Exam Updated Vital Signs BP 111/73 (BP Location: Left Arm)   Pulse 72   Temp 98.6 F (37 C) (Oral)   Resp 16   Ht 5\' 10"  (1.778 m)   Wt 72.5 kg   SpO2 100%   BMI 22.93 kg/m   Physical Exam Vitals signs and nursing note reviewed.  Constitutional:      Appearance: He is well-developed.  HENT:     Head: Normocephalic and atraumatic.  Cardiovascular:     Rate and Rhythm: Normal rate and regular rhythm.     Heart sounds: No murmur.  Pulmonary:     Effort: Pulmonary effort is normal. No respiratory distress.     Breath sounds: Normal breath sounds.  Abdominal:     Palpations: Abdomen is soft.     Tenderness: There is no abdominal tenderness. There is no guarding or rebound.  Musculoskeletal:        General: Swelling present. No tenderness.     Comments: Trace edema to bilateral lower extremities. 2+ DP pulses bilaterally.  Skin:    General: Skin is warm and dry.  Neurological:     Mental Status: He is alert and oriented to person, place, and time.  Psychiatric:        Behavior: Behavior normal.      ED Treatments / Results  Labs (all labs ordered are listed, but only abnormal results are displayed) Labs Reviewed - No data to display  EKG None  Radiology No results found.  Procedures Procedures (including critical care time)  Medications Ordered in ED Medications - No data to display   Initial Impression / Assessment and Plan / ED Course  I have reviewed the triage vital signs and the nursing notes.  Pertinent labs & imaging results that were available during my care of the patient were reviewed by me and considered in my medical decision making (see chart for details).        Patient here for evaluation of lower extremity edema for the last week. He does have a history of DVT but has not been on his Eliquis until  today. Reviewed records in epic with recent ED visit with labs performed yesterday. Labs within normal renal function, normal BNP. No clinical evidence of cellulitis. Discussed with patient that a vascular ultrasound today would be of low yield because it would not change the treatment that he needs to continue his Eliquis. He also recently started gabapentin a few weeks ago. Discussed with patient causes of lower extremity edema and treatments at home such as compressive stockings, low salt diet and elevating his legs. Plan to discharge home with outpatient follow-up and return precautions.  Final Clinical Impressions(s) / ED Diagnoses   Final diagnoses:  Leg edema    ED Discharge Orders    None       Tilden Fossa, MD 12/03/18 [phone]

## 2018-12-03 NOTE — Discharge Instructions (Addendum)
Continue your medications.  Get rechecked if you develop chest pain, difficulty breathing, or new concerning symptoms.

## 2018-12-07 ENCOUNTER — Other Ambulatory Visit: Payer: Self-pay | Admitting: Pharmacist

## 2018-12-07 MED ORDER — ATORVASTATIN CALCIUM 20 MG PO TABS: 20.0000 mg | ORAL_TABLET | Freq: Every day | ORAL | 3 refills | Status: AC

## 2018-12-13 NOTE — Progress Notes (Deleted)
Patient ID: Randall Simpson, male   DOB: [birth year], 53 y.o.   MRN: [id number] Virtual Visit via Telephone Note  I connected with Randall Simpson on 12/13/18 at  8:30 AM EDT by telephone and verified that I am speaking with the correct person using two identifiers.   Consent:  I discussed the limitations, risks, security and privacy concerns of performing an evaluation and management service by telephone and the availability of in person appointments. I also discussed with the patient that there may be a patient responsible charge related to this service. The patient expressed understanding and agreed to proceed.  Location of patient:  Location of provider:  Persons participating in the televisit with the patient.       History of Present Illness: Last seen 09/02/18 DVT (deep venous thrombosis) (HCC) Left lower extremity deep venous thrombosis with adverse reaction to Xarelto and under dosage of Xarelto  Discontinue Xarelto and begin Eliquis 10 mg twice daily for 7 days then reduce to 5 mg twice daily thereafter  History of MI (myocardial infarction) History of myocardial infarction in [place] 2 years ago currently no evidence of active cardiac ischemia  Alcohol withdrawal syndrome, with delirium (HCC) History of alcohol withdrawal syndrome with associated delirium that was treated now the patient appears to be slipping back into more alcohol withdrawal symptom complex  The patient was seen by licensed clinical social worker today to connect the patient with further alcohol counseling  Another course of tapering Librium was given to the patient  Tobacco abuse Smoking cessation counseling was given to the patient  Pure hypercholesterolemia With history of coronary artery disease and myocardial infarction will check lipid panel to assess for clinical cholesterol elevation    Single subsegmental pulmonary embolism without acute cor pulmonale Will treat pulmonary  embolism with Eliquis at the same time as treating the deep venous thrombosis  Since then in ED 6/3 and 6/4 for leg pain edema 6/3: Labs vastly reassuring including d-dimer, BNP, creatinine, hepatic function. EKG without signs of PE, RV strain. Discussed results with patient and re-evaluated it. Continues to denies pleuritic or exertional CP, SOB.  Given benign work up, exam, deemed appropriate for dc with eloquis. Case management consulted to assist with affording eloquis. Return precautions given. F/u with [facility] as needed. Discussed with EDP.  6/4:  Randall Simpson is a 53 y.o. male who presents to the Emergency Department complaining of leg swelling. He presents to the emergency department for evaluation of bilateral lower extremity edema that is been present for the last week. He describes swelling and discomfort to both legs. He does have a history of DVT as well as PE and is currently prescribed eloquence. He has been out of his Eliquis for a week or more and just got his refill the medication this morning and took his first dose. He denies any fevers, chest pain, shortness of breath, nausea, vomiting, abdominal pain. He presents to the emergency department because he is concerned that there might be another blood clot in his legs.  He did begin taking gabapentin a few weeks ago. 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  Patient here for evaluation of lower extremity edema for the last week. He does have a history of DVT but has not been on his Eliquis until today. Reviewed records in epic with recent ED visit with labs performed yesterday. Labs within normal renal function, normal BNP. No clinical evidence of cellulitis. Discussed  with patient that a vascular ultrasound today would be of low yield because it would not change the treatment that he needs to continue his Eliquis. He also recently started gabapentin a few weeks ago. Discussed with patient causes of  lower extremity edema and treatments at home such as compressive stockings, low salt diet and elevating his legs. Plan to discharge home with outpatient follow-up and return precautions  Observations/Objective:   Assessment and Plan:   Follow Up Instructions:    I discussed the assessment and treatment plan with the patient. The patient was provided an opportunity to ask questions and all were answered. The patient agreed with the plan and demonstrated an understanding of the instructions.   The patient was advised to call back or seek an in-person evaluation if the symptoms worsen or if the condition fails to improve as anticipated.  I provided *** minutes of non-face-to-face time during this encounter  including  median intraservice time , review of notes, labs, imaging, medications  and explaining diagnosis and management to the patient .    Asencion Noble, MD

## 2018-12-14 ENCOUNTER — Encounter: Payer: Self-pay | Admitting: Critical Care Medicine

## 2018-12-14 ENCOUNTER — Other Ambulatory Visit: Payer: Self-pay

## 2018-12-14 ENCOUNTER — Telehealth: Payer: Self-pay | Admitting: *Deleted

## 2018-12-14 ENCOUNTER — Ambulatory Visit: Payer: BC Managed Care – PPO | Attending: Critical Care Medicine | Admitting: Critical Care Medicine

## 2018-12-14 ENCOUNTER — Encounter (HOSPITAL_COMMUNITY): Payer: Self-pay | Admitting: Psychiatry

## 2018-12-14 VITALS — BP 122/78 | HR 59 | Temp 98.9°F | Resp 18 | Ht 70.0 in | Wt 181.0 lb

## 2018-12-14 DIAGNOSIS — J449 Chronic obstructive pulmonary disease, unspecified: Secondary | ICD-10-CM

## 2018-12-14 DIAGNOSIS — F17210 Nicotine dependence, cigarettes, uncomplicated: Secondary | ICD-10-CM

## 2018-12-14 DIAGNOSIS — M722 Plantar fascial fibromatosis: Secondary | ICD-10-CM | POA: Diagnosis not present

## 2018-12-14 DIAGNOSIS — I82452 Acute embolism and thrombosis of left peroneal vein: Secondary | ICD-10-CM

## 2018-12-14 DIAGNOSIS — I2693 Single subsegmental pulmonary embolism without acute cor pulmonale: Secondary | ICD-10-CM | POA: Diagnosis not present

## 2018-12-14 DIAGNOSIS — F1094 Alcohol use, unspecified with alcohol-induced mood disorder: Secondary | ICD-10-CM

## 2018-12-14 DIAGNOSIS — Z720 Tobacco use: Secondary | ICD-10-CM

## 2018-12-14 MED ORDER — BUDESONIDE-FORMOTEROL FUMARATE 160-4.5 MCG/ACT IN AERO: 2.0000 | INHALATION_SPRAY | Freq: Two times a day (BID) | RESPIRATORY_TRACT | 12 refills | Status: DC

## 2018-12-14 MED ORDER — TRAZODONE HCL 100 MG PO TABS: 100.0000 mg | ORAL_TABLET | Freq: Every evening | ORAL | 6 refills | Status: AC | PRN

## 2018-12-14 MED ORDER — APIXABAN 5 MG PO TABS: 5.0000 mg | ORAL_TABLET | Freq: Two times a day (BID) | ORAL | 3 refills | Status: AC

## 2018-12-14 NOTE — Assessment & Plan Note (Signed)
Bilateral mild plantar fasciitis  I recommended an orthotic insert and as well Tylenol as needed for pain as we need to avoid nonsteroidal anti-inflammatories with the anticoagulation and aspirin use  Exercises for the fasciitis were also given  We can consider sports medicine referral if this continues

## 2018-12-14 NOTE — Assessment & Plan Note (Signed)
Mild COPD exacerbation currently stable now however would benefit from controller therapy  We will begin Symbicort 2 inhalations twice daily 160 mcg strength  We will refill the albuterol

## 2018-12-14 NOTE — Assessment & Plan Note (Signed)
History of alcohol use now improved status post rehab stay

## 2018-12-14 NOTE — Assessment & Plan Note (Signed)
Tobacco use  Smoking cessation counseling given to the patient

## 2018-12-14 NOTE — Patient Instructions (Addendum)
I renewed her Eliquis and trazodone to the [facility] pharmacy your other medications have refills already  Follow exercise as below for your plantar fasciitis  Please obtain a primary care physician  I sent Symbicort 2 inhalations twice daily to [facility] please begin this medication  Work on tobacco cessation  Return to see Dr. Delford Field to follow-up on your blood clots in 3 months   Plantar Fasciitis  Plantar fasciitis is a painful foot condition that affects the heel. It occurs when the band of tissue that connects the toes to the heel bone (plantar fascia) becomes irritated. This can happen as the result of exercising too much or doing other repetitive activities (overuse injury). The pain from plantar fasciitis can range from mild irritation to severe pain that makes it difficult to walk or move. The pain is usually worse in the morning after sleeping, or after sitting or lying down for a while. Pain may also be worse after long periods of walking or standing. What are the causes? This condition may be caused by:  Standing for long periods of time.  Wearing shoes that do not have good arch support.  Doing activities that put stress on joints (high-impact activities), including running, aerobics, and ballet.  Being overweight.  An abnormal way of walking (gait).  Tight muscles in the back of your lower leg (calf).  High arches in your feet.  Starting a new athletic activity. What are the signs or symptoms? The main symptom of this condition is heel pain. Pain may:  Be worse with first steps after a time of rest, especially in the morning after sleeping or after you have been sitting or lying down for a while.  Be worse after long periods of standing still.  Decrease after 30-45 minutes of activity, such as gentle walking. How is this diagnosed? This condition may be diagnosed based on your medical history and your symptoms. Your health care provider may ask questions about your  activity level. Your health care provider will do a physical exam to check for:  A tender area on the bottom of your foot.  A high arch in your foot.  Pain when you move your foot.  Difficulty moving your foot. You may have imaging tests to confirm the diagnosis, such as:  X-rays.  Ultrasound.  MRI. How is this treated? Treatment for plantar fasciitis depends on how severe your condition is. Treatment may include:  Rest, ice, applying pressure (compression), and raising the affected foot (elevation). This may be called RICE therapy. Your health care provider may recommend RICE therapy along with over-the-counter pain medicines to manage your pain.  Exercises to stretch your calves and your plantar fascia.  A splint that holds your foot in a stretched, upward position while you sleep (night splint).  Physical therapy to relieve symptoms and prevent problems in the future.  Injections of steroid medicine (cortisone) to relieve pain and inflammation.  Stimulating your plantar fascia with electrical impulses (extracorporeal shock wave therapy). This is usually the last treatment option before surgery.  Surgery, if other treatments have not worked after 12 months. Follow these instructions at home:  Managing pain, stiffness, and swelling  If directed, put ice on the painful area: ? Put ice in a plastic bag, or use a frozen bottle of water. ? Place a towel between your skin and the bag or bottle. ? Roll the bottom of your foot over the bag or bottle. ? Do this for 20 minutes, 2-3 times a  day.  Wear athletic shoes that have air-sole or gel-sole cushions, or try wearing soft shoe inserts that are designed for plantar fasciitis.  Raise (elevate) your foot above the level of your heart while you are sitting or lying down. Activity  Avoid activities that cause pain. Ask your health care provider what activities are safe for you.  Do physical therapy exercises and stretches as  told by your health care provider.  Try activities and forms of exercise that are easier on your joints (low-impact). Examples include swimming, water aerobics, and biking. General instructions  Take over-the-counter and prescription medicines only as told by your health care provider.  Wear a night splint while sleeping, if told by your health care provider. Loosen the splint if your toes tingle, become numb, or turn cold and blue.  Maintain a healthy weight, or work with your health care provider to lose weight as needed.  Keep all follow-up visits as told by your health care provider. This is important. Contact a health care provider if you:  Have symptoms that do not go away after caring for yourself at home.  Have pain that gets worse.  Have pain that affects your ability to move or do your daily activities. Summary  Plantar fasciitis is a painful foot condition that affects the heel. It occurs when the band of tissue that connects the toes to the heel bone (plantar fascia) becomes irritated.  The main symptom of this condition is heel pain that may be worse after exercising too much or standing still for a long time.  Treatment varies, but it usually starts with rest, ice, compression, and elevation (RICE therapy) and over-the-counter medicines to manage pain. This information is not intended to replace advice given to you by your health care provider. Make sure you discuss any questions you have with your health care provider. Document Released: 03/12/2001 Document Revised: 04/14/2017 Document Reviewed: 04/14/2017 Elsevier Interactive Patient Education  2019 [employer].

## 2018-12-14 NOTE — Progress Notes (Signed)
Subjective:    Patient ID: Randall Simpson, male    DOB: 1965-10-24, 53 y.o.   MRN: [id number]  53 y.o.M with history of alcohol use and associated withdrawal syndrome after having been recently discharged from the hospital in February for alcohol withdrawal.  He then subsequently had deep venous thrombosis and subsegmental pulmonary emboli diagnosed.  The DVT was in the left lower extremity.  The patient had difficulty with Xarelto and had lip swelling and throat pain.  When I saw the patient March 2 we discontinued the Xarelto and switch the patient over to Eliquis.  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  The patient is stayed in the [place] area more recently.  Note the patient did have a myocardial infarction 2 years ago in [place] with coronary disease.  Note the patient now is on hypercholesterolemia medication.  The patient states he recently was in alcohol rehab for 7 days and now is in a halfway house for sober living.  Patient is now off Librium.  He does complain of bilateral foot pain which gabapentin did not help.  He had increased edema to lower extremities with gabapentin.  He went to the emergency room twice on the third and 4 June for lower extremity edema.  Work-up did not show recurrence of DVT  Since gabapentin was stopped and Eliquis resumed his swelling is gone down in the lower extremities.  Note this patient had been off Eliquis for 2 weeks but he is now back on the Eliquis.  Note the patient now has insurance.   The patient does state he has been diagnosed with COPD previously with his smoking use.  He has been on albuterol only as needed.  He still is coughing up brown mucus and is short of breath with exertion.  He has some wheezing.  He states he did have pulmonary functions in [place].  He has not seen a lung doctor since he has been in this area till he came to see me  in March.    Past Medical History:  Diagnosis Date  . Alcohol withdrawal ([place]) 08/14/2018  . Alcohol withdrawal syndrome, with delirium ([place]) 08/03/2018  . Atrial fibrillation ([place])   . Carotid artery disease ([place])   . DVT (deep venous thrombosis) ([place])   . ETOH abuse   . MI (myocardial infarction) ([place]) 06/02/2017   [place]     Family History  Problem Relation Age of Onset  . Cancer Mother   . Cancer Father      Social History   Socioeconomic History  . Marital status: Single    Spouse name: Not on file  . Number of children: Not on file  . Years of education: Not on file  . Highest education level: Not on file  Occupational History  . Not on file  Social Needs  . Financial resource strain: Not on file  . Food insecurity    Worry: Not on file    Inability: Not on file  . Transportation needs    Medical: Not on file    Non-medical: Not on file  Tobacco Use  . Smoking status: Current Every Day Smoker    Packs/day: 0.50    Types: Cigarettes  . Smokeless tobacco: Never Used  Substance and Sexual Activity  . Alcohol use: Yes    Comment: daily use-8 pack of wine and several pints of beer and  vodka   . Drug use: Never  . Sexual activity: Not Currently  Lifestyle  . Physical activity    Days per week: Not on file    Minutes per session: Not on file  . Stress: Not on file  Relationships  . Social Musician on phone: Not on file    Gets together: Not on file    Attends religious service: Not on file    Active member of club or organization: Not on file    Attends meetings of clubs or organizations: Not on file    Relationship status: Not on file  . Intimate partner violence    Fear of current or ex partner: Not on file    Emotionally abused: Not on file    Physically abused: Not on file    Forced sexual activity: Not on file  Other Topics Concern  . Not on file  Social History Narrative  . Not on file     Allergies  Allergen Reactions  .  Xarelto [Rivaroxaban] Other (See Comments)    Difficulty swallowing     Outpatient Medications Prior to Visit  Medication Sig Dispense Refill  . acetaminophen (TYLENOL) 325 MG tablet Take 650 mg by mouth daily.    Marland Kitchen albuterol (PROVENTIL HFA;VENTOLIN HFA) 108 (90 Base) MCG/ACT inhaler Inhale 2 puffs into the lungs every 6 (six) hours as needed for wheezing or shortness of breath.    Marland Kitchen aspirin 81 MG chewable tablet Chew by mouth daily.    Marland Kitchen atorvastatin (LIPITOR) 20 MG tablet Take 1 tablet (20 mg total) by mouth daily. 90 tablet 3  . COENZYME Q10 PO Take 1 capsule by mouth daily.    Marland Kitchen GARLIC PO Take 1 capsule by mouth daily.    . Multiple Vitamin (MULTIVITAMIN WITH MINERALS) TABS tablet Take 1 tablet by mouth daily.    Marland Kitchen apixaban (ELIQUIS) 5 MG TABS tablet Take 1 tablet (5 mg total) by mouth 2 (two) times daily for 30 days. Take two twice daily for 7 days then one twice daily 60 tablet 0  . traZODone (DESYREL) 100 MG tablet Take 100 mg by mouth at bedtime as needed for sleep.     Marland Kitchen gabapentin (NEURONTIN) 400 MG capsule Take 400 mg by mouth 3 (three) times daily as needed (For anxiety or restlessness.).      No facility-administered medications prior to visit.       Review of Systems  Constitutional: Negative for appetite change and fever.  HENT: Negative for facial swelling.   Respiratory: Positive for cough and shortness of breath.   Cardiovascular: Positive for leg swelling. Negative for chest pain.  Gastrointestinal: Negative.   Genitourinary: Negative.   Musculoskeletal:       Foot pain   Skin: Negative.   Neurological: Negative.   Psychiatric/Behavioral: Negative for confusion, decreased concentration, dysphoric mood, self-injury, sleep disturbance and suicidal ideas. The patient is not nervous/anxious.        Objective:   Physical Exam  Vitals:   12/14/18 0830  BP: 122/78  Pulse: (!) 59  Resp: 18  Temp: 98.9 F (37.2 C)  TempSrc: Oral  SpO2: 100%  Weight: 181 lb  (82.1 kg)  Height: 5\' 10"  (1.778 m)    Gen: Pleasant, well-nourished, in no distress,  normal affect  ENT: No lesions,  mouth clear,  oropharynx clear, no postnasal drip  Neck: No JVD, no TMG, no carotid bruits  Lungs: No use of accessory muscles, no dullness  to percussion, clear without rales or rhonchi  Cardiovascular: RRR, heart sounds normal, no murmur or gallops, no peripheral edema  Abdomen: soft and NT, no HSM,  BS normal  Musculoskeletal: No deformities, no cyanosis or clubbing Tender bilateral soles of feet  Neuro: alert, non focal  Skin: Warm, no lesions or rashes  2/12 CT Angio chest : Neg for PE DVT in LE  CT Angio 2/19 IMPRESSION: 1. Positive examination for pulmonary emboli. Distal segmental to subsegmental embolus present in the right lower lobe (series 5, image 227). This appears unchanged compared to prior examination. There is no definite embolus identified elsewhere.  2. RV LV ratio = 1.1. Correlate for clinical evidence of right heart strain and consider echocardiography, although heart strain secondary to pulmonary embolus seems unlikely small and distal burden of embolus.  3.  Bibasilar scarring or atelectasis.  These results were called by telephone at the time of interpretation on 08/19/2018 at 3:57 pm to Dr. Geoffery LyonsUGLAS DELO , who verbally acknowledged these results.   2/13 echo FINDINGS  Left Ventricle: The left ventricle has normal systolic function, with an ejection fraction of 60-65%. The cavity size was normal. There is no increase in left ventricular wall thickness. Left ventricular diastolic parameters were normal Right Ventricle: The right ventricle has normal systolic function. The cavity was normal. There is no increase in right ventricular wall thickness. The right ventricle was not well visualized. Left Atrium: left atrial size was normal in size Right Atrium: right atrial size was normal in size Interatrial Septum: No atrial  level shunt detected by color flow Doppler. Pericardium: There is no evidence of pericardial effusion. Mitral Valve: The mitral valve is normal in structure. Mitral valve regurgitation is trivial by color flow Doppler. Tricuspid Valve: The tricuspid valve is normal in structure. Tricuspid valve regurgitation is trivial by color flow Doppler. Aortic Valve: The aortic valve is tricuspid There is mild thickening of the aortic valve. Aortic valve regurgitation was not visualized by color flow Doppler. Pulmonic Valve: The pulmonic valve was normal in structure. Pulmonic valve regurgitation is not visualized by color flow Doppler. Venous: The inferior vena cava is normal in size with greater than 50% respiratory variability      Assessment & Plan:  I personally reviewed all images and lab [facility] as well as any outside material available during this office visit and agree with the  radiology impressions.   DVT (deep venous thrombosis) (HCC) History of left lower extremity deep venous thrombosis with sub-segmental pulmonary embolism  Current plan is to continue Eliquis for at least 1 year at 5 mg twice daily  At some point will need to assess further hypercoagulable state    COPD with chronic bronchitis (HCC) Mild COPD exacerbation currently stable now however would benefit from controller therapy  We will begin Symbicort 2 inhalations twice daily 160 mcg strength  We will refill the albuterol  Alcohol use with alcohol-induced mood disorder (HCC) History of alcohol use now improved status post rehab stay  Tobacco abuse Tobacco use  Smoking cessation counseling given to the patient  Plantar fasciitis Bilateral mild plantar fasciitis  I recommended an orthotic insert and as well Tylenol as needed for pain as we need to avoid nonsteroidal anti-inflammatories with the anticoagulation and aspirin use  Exercises for the fasciitis were also given  We can consider sports  medicine referral if this continues   Marcy SalvoRaymond was seen today for follow-up.  Diagnoses and all orders for this  visit:  Acute deep vein thrombosis (DVT) of left peroneal vein  Single subsegmental pulmonary embolism without acute cor pulmonale  Tobacco abuse  COPD with chronic bronchitis (HCC)  Plantar fasciitis  Alcohol use with alcohol-induced mood disorder (HCC)  Other orders -     budesonide-formoterol (SYMBICORT) 160-4.5 MCG/ACT inhaler; Inhale 2 puffs into the lungs 2 (two) times daily. -     apixaban (ELIQUIS) 5 MG TABS tablet; Take 1 tablet (5 mg total) by mouth 2 (two) times daily. -     traZODone (DESYREL) 100 MG tablet; Take 1 tablet (100 mg total) by mouth at bedtime as needed for sleep.   The patient now has private insurance and will focus on obtaining his own primary care provider

## 2018-12-14 NOTE — Telephone Encounter (Signed)
Late entry 12/03/2018  TOC CM arranged follow up appointment for 12/14/2018. Did reviewed of chart and pt did keep make appt scheduled on 12/14/2018. Jonnie Finner RN CCM Case Mgmt phone [phone]

## 2018-12-14 NOTE — Assessment & Plan Note (Signed)
History of left lower extremity deep venous thrombosis with sub-segmental pulmonary embolism  Current plan is to continue Eliquis for at least 1 year at 5 mg twice daily  At some point will need to assess further hypercoagulable state

## 2019-01-04 ENCOUNTER — Encounter (HOSPITAL_COMMUNITY): Payer: Self-pay | Admitting: Emergency Medicine

## 2019-01-04 ENCOUNTER — Encounter (HOSPITAL_COMMUNITY): Payer: Self-pay | Admitting: *Deleted

## 2019-01-04 ENCOUNTER — Other Ambulatory Visit: Payer: Self-pay

## 2019-01-04 ENCOUNTER — Other Ambulatory Visit: Payer: Self-pay | Admitting: Behavioral Health

## 2019-01-04 ENCOUNTER — Emergency Department (HOSPITAL_COMMUNITY)
Admission: EM | Admit: 2019-01-04 | Discharge: 2019-01-04 | Disposition: A | Discharge status: Other Acute Inpatient Hospital | Payer: Self-pay | Attending: Emergency Medicine | Admitting: Emergency Medicine

## 2019-01-04 ENCOUNTER — Observation Stay (HOSPITAL_COMMUNITY)
Admission: RE | Admit: 2019-01-04 | Discharge: 2019-01-05 | Disposition: A | Discharge status: Home / Self Care | Payer: Self-pay | Attending: Psychiatry | Admitting: Psychiatry

## 2019-01-04 DIAGNOSIS — Z7982 Long term (current) use of aspirin: Secondary | ICD-10-CM | POA: Insufficient documentation

## 2019-01-04 DIAGNOSIS — Z7951 Long term (current) use of inhaled steroids: Secondary | ICD-10-CM | POA: Insufficient documentation

## 2019-01-04 DIAGNOSIS — F1010 Alcohol abuse, uncomplicated: Secondary | ICD-10-CM | POA: Insufficient documentation

## 2019-01-04 DIAGNOSIS — R45851 Suicidal ideations: Secondary | ICD-10-CM | POA: Insufficient documentation

## 2019-01-04 DIAGNOSIS — F17210 Nicotine dependence, cigarettes, uncomplicated: Secondary | ICD-10-CM | POA: Insufficient documentation

## 2019-01-04 DIAGNOSIS — I2510 Atherosclerotic heart disease of native coronary artery without angina pectoris: Secondary | ICD-10-CM | POA: Insufficient documentation

## 2019-01-04 DIAGNOSIS — Z79899 Other long term (current) drug therapy: Secondary | ICD-10-CM | POA: Insufficient documentation

## 2019-01-04 DIAGNOSIS — F1024 Alcohol dependence with alcohol-induced mood disorder: Secondary | ICD-10-CM | POA: Diagnosis present

## 2019-01-04 DIAGNOSIS — F1094 Alcohol use, unspecified with alcohol-induced mood disorder: Principal | ICD-10-CM | POA: Insufficient documentation

## 2019-01-04 DIAGNOSIS — Z7901 Long term (current) use of anticoagulants: Secondary | ICD-10-CM | POA: Insufficient documentation

## 2019-01-04 DIAGNOSIS — I4891 Unspecified atrial fibrillation: Secondary | ICD-10-CM | POA: Insufficient documentation

## 2019-01-04 DIAGNOSIS — I252 Old myocardial infarction: Secondary | ICD-10-CM | POA: Insufficient documentation

## 2019-01-04 DIAGNOSIS — Y907 Blood alcohol level of 200-239 mg/100 ml: Secondary | ICD-10-CM | POA: Insufficient documentation

## 2019-01-04 DIAGNOSIS — F329 Major depressive disorder, single episode, unspecified: Secondary | ICD-10-CM | POA: Insufficient documentation

## 2019-01-04 DIAGNOSIS — Z86718 Personal history of other venous thrombosis and embolism: Secondary | ICD-10-CM | POA: Insufficient documentation

## 2019-01-04 DIAGNOSIS — Z03818 Encounter for observation for suspected exposure to other biological agents ruled out: Secondary | ICD-10-CM | POA: Insufficient documentation

## 2019-01-04 LAB — CBC WITH DIFFERENTIAL/PLATELET
Abs Immature Granulocytes: 0.01 10*3/uL (ref 0.00–0.07)
Basophils Absolute: 0 10*3/uL (ref 0.0–0.1)
Basophils Relative: 0 %
Eosinophils Absolute: 0 10*3/uL (ref 0.0–0.5)
Eosinophils Relative: 1 %
HCT: 46.8 % (ref 39.0–52.0)
Hemoglobin: 14.9 g/dL (ref 13.0–17.0)
Immature Granulocytes: 0 %
Lymphocytes Relative: 42 %
Lymphs Abs: 2.6 10*3/uL (ref 0.7–4.0)
MCH: 27.1 pg (ref 26.0–34.0)
MCHC: 31.8 g/dL (ref 30.0–36.0)
MCV: 85.2 fL (ref 80.0–100.0)
Monocytes Absolute: 0.4 10*3/uL (ref 0.1–1.0)
Monocytes Relative: 6 %
Neutro Abs: 3.1 10*3/uL (ref 1.7–7.7)
Neutrophils Relative %: 51 %
Platelets: 249 10*3/uL (ref 150–400)
RBC: 5.49 MIL/uL (ref 4.22–5.81)
RDW: 15.6 % — ABNORMAL HIGH (ref 11.5–15.5)
WBC: 6.1 10*3/uL (ref 4.0–10.5)
nRBC: 0 % (ref 0.0–0.2)

## 2019-01-04 LAB — COMPREHENSIVE METABOLIC PANEL
ALT: 36 U/L (ref 0–44)
AST: 23 U/L (ref 15–41)
Albumin: 4.4 g/dL (ref 3.5–5.0)
Alkaline Phosphatase: 68 U/L (ref 38–126)
Anion gap: 10 (ref 5–15)
BUN: 10 mg/dL (ref 6–20)
CO2: 23 mmol/L (ref 22–32)
Calcium: 8.9 mg/dL (ref 8.9–10.3)
Chloride: 108 mmol/L (ref 98–111)
Creatinine, Ser: 0.83 mg/dL (ref 0.61–1.24)
GFR calc Af Amer: >60 mL/min (ref >60)
GFR calc non Af Amer: >60 mL/min (ref >60)
Glucose, Bld: 95 mg/dL (ref 70–99)
Potassium: 4.2 mmol/L (ref 3.5–5.1)
Sodium: 141 mmol/L (ref 135–145)
Total Bilirubin: 1.1 mg/dL (ref 0.3–1.2)
Total Protein: 7.6 g/dL (ref 6.5–8.1)

## 2019-01-04 LAB — RAPID URINE DRUG SCREEN, HOSP PERFORMED
Amphetamines: NOT DETECTED
Barbiturates: NOT DETECTED
Benzodiazepines: NOT DETECTED
Cocaine: NOT DETECTED
Opiates: NOT DETECTED
Tetrahydrocannabinol: NOT DETECTED

## 2019-01-04 LAB — ACETAMINOPHEN LEVEL: Acetaminophen (Tylenol), Serum: <10 ug/mL — ABNORMAL LOW (ref 10–30)

## 2019-01-04 LAB — SARS CORONAVIRUS 2 BY RT PCR (HOSPITAL ORDER, PERFORMED IN ~~LOC~~ HOSPITAL LAB): SARS Coronavirus 2: NEGATIVE

## 2019-01-04 LAB — SALICYLATE LEVEL: Salicylate Lvl: <7 mg/dL (ref 2.8–30.0)

## 2019-01-04 LAB — ETHANOL: Alcohol, Ethyl (B): 208 mg/dL — ABNORMAL HIGH (ref <10)

## 2019-01-04 MED ORDER — TRAZODONE HCL 100 MG PO TABS
100.0000 mg | ORAL_TABLET | Freq: Every evening | ORAL | Status: DC | PRN
  Administered 2019-01-04: 100 mg via ORAL
  Filled 2019-01-04: qty 1

## 2019-01-04 MED ORDER — MOMETASONE FURO-FORMOTEROL FUM 200-5 MCG/ACT IN AERO
2.0000 | INHALATION_SPRAY | Freq: Two times a day (BID) | RESPIRATORY_TRACT | Status: DC
  Administered 2019-01-04 – 2019-01-05 (×2): 2 via RESPIRATORY_TRACT
  Filled 2019-01-04: qty 8.8

## 2019-01-04 MED ORDER — APIXABAN 5 MG PO TABS
5.0000 mg | ORAL_TABLET | Freq: Two times a day (BID) | ORAL | Status: DC
  Administered 2019-01-05: 5 mg via ORAL
  Filled 2019-01-04 (×7): qty 1

## 2019-01-04 MED ORDER — ATORVASTATIN CALCIUM 20 MG PO TABS: 20.0000 mg | ORAL_TABLET | Freq: Every day | ORAL | Status: DC

## 2019-01-04 MED ORDER — THIAMINE HCL 100 MG/ML IJ SOLN: 100.0000 mg | Freq: Once | INTRAMUSCULAR | Status: DC

## 2019-01-04 MED ORDER — ALUM & MAG HYDROXIDE-SIMETH 200-200-20 MG/5ML PO SUSP: 30.0000 mL | Freq: Four times a day (QID) | ORAL | Status: DC | PRN

## 2019-01-04 MED ORDER — THIAMINE HCL 100 MG/ML IJ SOLN: 100.0000 mg | Freq: Every day | INTRAMUSCULAR | Status: DC

## 2019-01-04 MED ORDER — ACETAMINOPHEN 325 MG PO TABS: 650.0000 mg | ORAL_TABLET | ORAL | Status: DC | PRN

## 2019-01-04 MED ORDER — ALBUTEROL SULFATE HFA 108 (90 BASE) MCG/ACT IN AERS
2.0000 | INHALATION_SPRAY | Freq: Four times a day (QID) | RESPIRATORY_TRACT | Status: DC | PRN
  Administered 2019-01-04: 2 via RESPIRATORY_TRACT
  Filled 2019-01-04: qty 6.7

## 2019-01-04 MED ORDER — LORAZEPAM 1 MG PO TABS
0.0000 mg | ORAL_TABLET | Freq: Four times a day (QID) | ORAL | Status: DC
  Administered 2019-01-04: 1 mg via ORAL
  Filled 2019-01-04: qty 1

## 2019-01-04 MED ORDER — LORAZEPAM 1 MG PO TABS: 0.0000 mg | ORAL_TABLET | Freq: Two times a day (BID) | ORAL | Status: DC

## 2019-01-04 MED ORDER — LORAZEPAM 1 MG PO TABS
1.0000 mg | ORAL_TABLET | Freq: Four times a day (QID) | ORAL | Status: DC | PRN
  Administered 2019-01-05: 1 mg via ORAL
  Filled 2019-01-04: qty 1

## 2019-01-04 MED ORDER — APIXABAN 5 MG PO TABS: 5.0000 mg | ORAL_TABLET | Freq: Two times a day (BID) | ORAL | Status: DC

## 2019-01-04 MED ORDER — LOPERAMIDE HCL 2 MG PO CAPS: 2.0000 mg | ORAL_CAPSULE | ORAL | Status: DC | PRN

## 2019-01-04 MED ORDER — ALUM & MAG HYDROXIDE-SIMETH 200-200-20 MG/5ML PO SUSP: 30.0000 mL | ORAL | Status: DC | PRN

## 2019-01-04 MED ORDER — ASPIRIN 81 MG PO CHEW: 81.0000 mg | CHEWABLE_TABLET | Freq: Every day | ORAL | Status: DC

## 2019-01-04 MED ORDER — ALBUTEROL SULFATE HFA 108 (90 BASE) MCG/ACT IN AERS: 2.0000 | INHALATION_SPRAY | Freq: Four times a day (QID) | RESPIRATORY_TRACT | Status: DC | PRN

## 2019-01-04 MED ORDER — HYDROXYZINE HCL 25 MG PO TABS: 25.0000 mg | ORAL_TABLET | Freq: Four times a day (QID) | ORAL | Status: DC | PRN

## 2019-01-04 MED ORDER — TRAZODONE HCL 100 MG PO TABS: 100.0000 mg | ORAL_TABLET | Freq: Every evening | ORAL | Status: DC | PRN

## 2019-01-04 MED ORDER — LORAZEPAM 2 MG/ML IJ SOLN: 0.0000 mg | Freq: Four times a day (QID) | INTRAMUSCULAR | Status: DC

## 2019-01-04 MED ORDER — VITAMIN B-1 100 MG PO TABS
100.0000 mg | ORAL_TABLET | Freq: Every day | ORAL | Status: DC
  Administered 2019-01-05: 100 mg via ORAL

## 2019-01-04 MED ORDER — LORAZEPAM 2 MG/ML IJ SOLN: 0.0000 mg | Freq: Two times a day (BID) | INTRAMUSCULAR | Status: DC

## 2019-01-04 MED ORDER — NICOTINE 21 MG/24HR TD PT24: 21.0000 mg | MEDICATED_PATCH | Freq: Every day | TRANSDERMAL | Status: DC

## 2019-01-04 MED ORDER — ONDANSETRON 4 MG PO TBDP: 4.0000 mg | ORAL_TABLET | Freq: Four times a day (QID) | ORAL | Status: DC | PRN

## 2019-01-04 MED ORDER — ACETAMINOPHEN 325 MG PO TABS: 650.0000 mg | ORAL_TABLET | Freq: Four times a day (QID) | ORAL | Status: DC | PRN

## 2019-01-04 MED ORDER — ADULT MULTIVITAMIN W/MINERALS CH
1.0000 | ORAL_TABLET | Freq: Every day | ORAL | Status: DC
  Administered 2019-01-05: 1 via ORAL

## 2019-01-04 MED ORDER — ZOLPIDEM TARTRATE 5 MG PO TABS: 5.0000 mg | ORAL_TABLET | Freq: Every evening | ORAL | Status: DC | PRN

## 2019-01-04 MED ORDER — ATORVASTATIN CALCIUM 10 MG PO TABS
20.0000 mg | ORAL_TABLET | Freq: Every day | ORAL | Status: DC
  Administered 2019-01-05: 20 mg via ORAL
  Filled 2019-01-04: qty 2

## 2019-01-04 MED ORDER — VITAMIN B-1 100 MG PO TABS
100.0000 mg | ORAL_TABLET | Freq: Every day | ORAL | Status: DC
  Administered 2019-01-04: 100 mg via ORAL
  Filled 2019-01-04: qty 1

## 2019-01-04 MED ORDER — ONDANSETRON HCL 4 MG PO TABS: 4.0000 mg | ORAL_TABLET | Freq: Three times a day (TID) | ORAL | Status: DC | PRN

## 2019-01-04 NOTE — Progress Notes (Signed)
Pt is a 53 y.o. male that walked-in for help stating that he has a problem with drinking and bad thoughts.  States that he has a therapist but doesn't actually see or talk to a therapist, "That doesn't work, I just need somebody when I need somebody- like now." States that he was prescribed medicine but does not take it, "Why would I?  I am here now.  Pt states that he has been drinking alcohol today but did not elaborate how much or what he drinks, he appeared confused and "under the influence." He was sent to [facility] for medical clearance.  Pt returned to Observation after clearance with clarity and able to answer admission questions. He states that he hears voices "when drinking, but it's just my voice."  He did not share the amount of alcohol consumed daily, "I'm in Sober living and I want to be careful not to get in trouble."   He denies other substance abuse.  Denies current AVH and is able to contract for safety.  Admission assessment and search completed,  Belongings listed and secured.  Treatment plan explained and pt. oriented to unit.

## 2019-01-04 NOTE — ED Notes (Signed)
Pt discharge safely with [place] driver.  All belongings were sent with patient.

## 2019-01-04 NOTE — H&P (Signed)
Behavioral Health Medical Screening Exam  Randall Simpson is an 53 y.o. male.who presents as a Estate manager/land agent. When asked his reason for seeking psychiatric help he replied, " for my mental health." He states he is not suicidal although just prior to this assessment he disclosed to the counselor that he had passive suicidal thoughts.  He also stated to the counselor that he had being experiencing command hallucinations for the past month yet, when writer asked him about hallucinations he replied," I hear voices but it just me talking to myself." His stories seem to be very conflicting. He does admit that he has a significant alcohol abuse history and today, he is visibly intoxicated, with disorganized thoughts.  He admits to drinking three bottles of wine today. He has had In the past seizures and withdrawal secondary  to his substance abuse. Per chart review, he has a history of depression and he was seen in the ED 11/16/2018 for alcohol abuse and depression.  Total Time spent with patient: 15 minutes  Psychiatric Specialty Exam: Physical Exam  Nursing note and vitals reviewed. Constitutional: He is oriented to person, place, and time.  Neurological: He is alert and oriented to person, place, and time.    Review of Systems  Psychiatric/Behavioral: Positive for hallucinations and substance abuse. Negative for depression, memory loss and suicidal ideas. The patient is not nervous/anxious and does not have insomnia.     Blood pressure 102/82, pulse 74, temperature 98.3 F (36.8 C), temperature source Oral, resp. rate 17, SpO2 98 %.There is no height or weight on file to calculate BMI.  General Appearance: Fairly Groomed  Eye Contact:  Fair  Speech:  Slurred  Volume:  Normal  Mood:  Depressed  Affect:  Congruent  Thought Process:  Disorganized  Orientation:  Full (Time, Place, and Person)  Thought Content:  Hallucinations: Auditory  Suicidal Thoughts:  enodrses to counslor vague SI  Homicidal  Thoughts:  No  Memory:  Immediate;   Fair Recent;   Fair  Judgement:  Impaired  Insight:  Shallow  Psychomotor Activity:  Normal  Concentration: Concentration: Fair and Attention Span: Fair  Recall:  [employer] of Knowledge:Fair  Language: Good  Akathisia:  Negative  Handed:  Right  AIMS (if indicated):     Assets:  Communication Skills Desire for Improvement Resilience  Sleep:       Musculoskeletal: Strength & Muscle Tone: within normal limits Gait & Station: unsteady Patient leans: N/A  Blood pressure 102/82, pulse 74, temperature 98.3 F (36.8 C), temperature source Oral, resp. rate 17, SpO2 98 %.  Recommendations:  Patient will be sent to the ED for medical clearance. Following clearance, I am recommending overnight observation and for him to be re-seen by psychiatry tomorrow.  Patient will be started on Ativan CIWA protocol  Mordecai Maes, NP 01/04/2019, 12:34 PM

## 2019-01-04 NOTE — H&P (Signed)
BH Observation Unit Provider Admission PAA/H&P  Patient Identification: Randall Simpson MRN:  [id number] Date of Evaluation:  01/04/2019 Chief Complaint:  Detox Principal Diagnosis: <principal problem not specified> Diagnosis:  Active Problems:   * No active hospital problems. *  History of Present Illness: Randall FlockRaymond Wingert is an 53 y.o. male.who presents as a Public librarianvoluntary walk-in. When asked his reason for seeking psychiatric help he replied, " for my mental health." He states he is not suicidal although just prior to this assessment he disclosed to the counselor that he had passive suicidal thoughts.  He also stated to the counselor that he had being experiencing command hallucinations for the past month yet, when writer asked him about hallucinations he replied," I hear voices but it just me talking to myself." His stories seem to be very conflicting. He does admit that he has a significant alcohol abuse history and today, he is visibly intoxicated, with disorganized thoughts.  He admits to drinking three bottles of wine today. He has hadIn the past seizures and withdrawal secondary  to his substance abuse. Per chart review, he has a history of depression and he was seen in the ED 11/16/2018 for alcohol abuse. He endorses one prior SA that per his report, occurred several years ago. He denies other psychotic symptoms. Denies other substance abuse. Denies homicidal ideations.    Associated Signs/Symptoms: Depression Symptoms:  depressed mood, suicidal thoughts without plan, suicidal thoughts are passive.  (Hypo) Manic Symptoms:  none Anxiety Symptoms:  none Psychotic Symptoms:  Hallucinations: Auditory PTSD Symptoms: NA Total Time spent with patient: 20 minutes  Past Psychiatric History: alcohol induced mood disorder/depression.   Is the patient at risk to self? Yes.    Has the patient been a risk to self in the past 6 months? Yes.    Has the patient been a risk to self within the distant past?  Yes.    Is the patient a risk to others? No.  Has the patient been a risk to others in the past 6 months? No.  Has the patient been a risk to others within the distant past? No.   Prior Inpatient Therapy: Prior Inpatient Therapy: Yes Prior Therapy Dates: 2020 [facility] Reason for Treatment: MH issues Prior Outpatient Therapy: Prior Outpatient Therapy: No Does patient have an ACCT team?: No Does patient have Intensive In-House Services?  : No Does patient have [facility] services? : No Does patient have P4CC services?: No  Alcohol Screening:   Substance Abuse History in the last 12 months:  Yes.   Consequences of Substance Abuse: withdrawal, seizures per chart review  Previous Psychotropic Medications: Yes  Psychological Evaluations: No  Past Medical History:  Past Medical History:  Diagnosis Date  . Alcohol withdrawal (HCC) 08/14/2018  . Alcohol withdrawal syndrome, with delirium (HCC) 08/03/2018  . Atrial fibrillation (HCC)   . Carotid artery disease (HCC)   . DVT (deep venous thrombosis) (HCC)   . ETOH abuse   . MI (myocardial infarction) (HCC) 06/02/2017   [place]    Past Surgical History:  Procedure Laterality Date  . SHOULDER SURGERY     Family History:  Family History  Problem Relation Age of Onset  . Cancer Mother   . Cancer Father    Family Psychiatric History: Unknown  Tobacco Screening:   Social History:  Social History   Substance and Sexual Activity  Alcohol Use Yes   Comment: daily use-8 pack of wine and several pints of beer and  vodka      Social History   Substance and Sexual Activity  Drug Use Never    Additional Social History: Marital status: Single    Pain Medications: See MAR Prescriptions: See MAR Over the Counter: See MAR History of alcohol / drug use?: Yes Longest period of sobriety (when/how long): Unknown Negative Consequences of Use: Financial Withdrawal Symptoms: Agitation,  Irritability Name of Substance 1: Alcohol 1 - Age of First Use: 20 1 - Amount (size/oz): Varies 1 - Frequency: Varies 1 - Duration: Ongoing 1 - Last Use / Amount: 01/04/19 2 bottles of wine                  Allergies:   Allergies  Allergen Reactions  . Xarelto [Rivaroxaban] Other (See Comments)    Difficulty swallowing   Lab Results: No results found for this or any previous visit (from the past 48 hour(s)).  Blood Alcohol level:  Lab Results  Component Value Date   ETH 134 (H) 11/16/2018   ETH 80 (H) 28/41/3244    Metabolic Disorder Labs:  No results found for: HGBA1C, MPG No results found for: PROLACTIN Lab Results  Component Value Date   CHOL 175 09/02/2018   TRIG 119 09/02/2018   HDL 43 09/02/2018   CHOLHDL 4.1 09/02/2018   LDLCALC 108 (H) 09/02/2018    Current Medications: Current Outpatient Medications  Medication Sig Dispense Refill  . acetaminophen (TYLENOL) 325 MG tablet Take 650 mg by mouth daily.    Marland Kitchen albuterol (PROVENTIL HFA;VENTOLIN HFA) 108 (90 Base) MCG/ACT inhaler Inhale 2 puffs into the lungs every 6 (six) hours as needed for wheezing or shortness of breath.    Marland Kitchen apixaban (ELIQUIS) 5 MG TABS tablet Take 1 tablet (5 mg total) by mouth 2 (two) times daily. 60 tablet 3  . aspirin 81 MG chewable tablet Chew by mouth daily.    Marland Kitchen atorvastatin (LIPITOR) 20 MG tablet Take 1 tablet (20 mg total) by mouth daily. 90 tablet 3  . budesonide-formoterol (SYMBICORT) 160-4.5 MCG/ACT inhaler Inhale 2 puffs into the lungs 2 (two) times daily. 1 Inhaler 12  . COENZYME Q10 PO Take 1 capsule by mouth daily.    Marland Kitchen GARLIC PO Take 1 capsule by mouth daily.    . Multiple Vitamin (MULTIVITAMIN WITH MINERALS) TABS tablet Take 1 tablet by mouth daily.    . traZODone (DESYREL) 100 MG tablet Take 1 tablet (100 mg total) by mouth at bedtime as needed for sleep. 30 tablet 6   No current facility-administered medications for this encounter.    PTA Medications: (Not in a  hospital admission)   Musculoskeletal: Strength & Muscle Tone: within normal limits Gait & Station: unsteady Patient leans: N/A  Psychiatric Specialty Exam: Physical Exam  Nursing note and vitals reviewed. Constitutional: He is oriented to person, place, and time.  Neurological: He is alert and oriented to person, place, and time.    ROS  Blood pressure 102/82, pulse 74, temperature 98.3 F (36.8 C), temperature source Oral, resp. rate 17, SpO2 98 %.There is no height or weight on file to calculate BMI.  General Appearance: Fairly Groomed  Eye Contact:  Fair  Speech:  Slurred  Volume:  Normal  Mood:  Depressed  Affect:  Congruent  Thought Process:  Disorganized  Orientation:  Full (Time, Place, and Person)  Thought Content:  Hallucinations: Auditory  Suicidal Thoughts:  passive SI. Very vague   Homicidal Thoughts:  No  Memory:  Immediate;   Fair Recent;  Fair  Judgement:  Fair  Insight:  Fair  Psychomotor Activity:  Normal  Concentration:  Concentration: Fair and Attention Span: Fair  Recall:  [employer] of Knowledge:  Fair  Language:  Good  Akathisia:  Negative  Handed:  Right  AIMS (if indicated):     Assets:  Communication Skills Desire for Improvement Resilience  ADL's:  Intact  Cognition:  WNL  Sleep:         Treatment Plan Summary: Daily contact with patient to assess and evaluate symptoms and progress in treatment  Observation Level/Precautions:  15 minute checks Laboratory: Patient requires medical clearances before coming to the unit. Labs will be obtained at the ED. Recommended labs, EKG, Ethanol, CBC, CMP, Lipid panel, TSH, HgbA1c.     Medications: Pending lab results partial AST and ALT, recommending Ativan or Librium detox protocol.  I have ordered Ativan detox protocol however, if needed, this can be changed to Librium. Estimated LOS: To be determined.       Denzil Magnuson, NP 7/6/202012:59 PM

## 2019-01-04 NOTE — ED Triage Notes (Signed)
Patient states he is here because of "Alcoholism and Crazy"  He states he hears voices sometime.  Previous note from [facility] states he is suicidal.

## 2019-01-04 NOTE — BH Assessment (Signed)
[place] Assessment Progress Note Case was staffed with Marcello Moores NP who recommended patient be observed and monitored after being medically cleared.

## 2019-01-04 NOTE — BH Assessment (Addendum)
Assessment Note  Randall Simpson is an 53 y.o. male that presents this date with S/I. Patient denies any specific plan or intent although reports "it is all he thinks about." Patient denies any H/I although reports active AVH. Patient denies any AVH prior to a month ago when he reported he first started hearing voices and seeing "shadows." Patient denies the voices are command in nature and is vague in reference to content. Patient states the voices "are really scary" and is requesting a inpatient admission to assist with stabilization. Patient reports he was last inpatient at [facility] in 2019 for ongoing depression and SA issues. Patient denies having a current OP provider. Patient reports daily alcohol use for the last year stating he consumes 3 to 4 bottles of wine daily with last reported use earlier this date when patient reported he consumed two bottles of wine. Patient is actively impaired at the time of assessment and renders limited history. Patient when asked in reference to orientation states "I am not sure you tell me." Patient is observed to be impaired and speaks in a slow soft slurred voice. Patient denies any previous attempts or gestures at self harm or self injurious behaviors. Patient was last seen per chart review in 2019 when he presented at that time to [facility] for evaluation of alcohol abuse and depression.  Patient reports a ongoing history of alcohol use. Patient reports this date he is aware he "needs to stop drinking before it kills him or he kills himself." patient denies any current withdrawals or use of any other illicit substances. Patient reports he has a history of seizures in the past associated with alcohol withdrawals although cannot recall when he had his last seizure. Per notes from NP this date. Patient presents as a voluntary walk-in. When asked his reason for seeking psychiatric help he replied, " for my mental health." He states he is not suicidal although just prior to  this assessment he disclosed to the counselor that he had active suicidal thoughts. He also stated to the counselor that he had being experiencing command hallucinations for the past month yet, when writer asked him about hallucinations he replied," I hear voices but it just me talking to myself." His stories seem to be very conflicting. He does admit that he has a significant alcohol abuse history and today, he is visibly intoxicated. He admits to drinking three bottles of wine today. He has hadIn the past seizures and withdrawal secondary  to his substance abuse. Per chart review, he has a history of depression and he was seen in the ED 11/16/2018 for alcohol abuse and depression. Case was staffed with Marcello Moores NP who recommended patient be observed and monitored after being medically cleared.        Diagnosis: MDD recurrent without psychotic features, severe, Alcohol abuse.  Past Medical History:  Past Medical History:  Diagnosis Date  . Alcohol withdrawal ([place]) 08/14/2018  . Alcohol withdrawal syndrome, with delirium ([place]) 08/03/2018  . Atrial fibrillation ([place])   . Carotid artery disease ([place])   . DVT (deep venous thrombosis) ([place])   . ETOH abuse   . MI (myocardial infarction) ([place]) 06/02/2017   [place]    Past Surgical History:  Procedure Laterality Date  . SHOULDER SURGERY      Family History:  Family History  Problem Relation Age of Onset  . Cancer Mother   . Cancer Father     Social History:  reports that he has been smoking cigarettes.  He has been smoking about 0.50 packs per day. He has never used smokeless tobacco. He reports current alcohol use. He reports that he does not use drugs.  Additional Social History:  Alcohol / Drug Use Pain Medications: See MAR Prescriptions: See MAR Over the Counter: See MAR History of alcohol / drug use?: Yes Longest period of sobriety (when/how long): Unknown Negative Consequences of Use: Financial Withdrawal Symptoms: Agitation,  Irritability Substance #1 Name of Substance 1: Alcohol 1 - Age of First Use: 20 1 - Amount (size/oz): Varies 1 - Frequency: Varies 1 - Duration: Ongoing 1 - Last Use / Amount: 01/04/19 2 bottles of wine  CIWA: CIWA-Ar BP: 102/82 Pulse Rate: 74 COWS:    Allergies:  Allergies  Allergen Reactions  . Xarelto [Rivaroxaban] Other (See Comments)    Difficulty swallowing    Home Medications: (Not in a hospital admission)   OB/GYN Status:  No LMP for male patient.  General Assessment Data [facility] Assessment: In system Is this a Tele or Face-to-Face Assessment?: Face-to-Face Is this an Initial Assessment or a Re-assessment for this encounter?: Initial Assessment Patient Accompanied by:: N/A Language Other than English: No Living Arrangements: Other (Comment) What gender do you identify as?: Male Marital status: Single Living Arrangements: Non-relatives/Friends Can pt return to current living arrangement?: Yes Admission Status: Voluntary Is patient capable of signing voluntary admission?: Yes Referral Source: Self/Family/Friend Insurance type: Self Pay  Medical Screening Exam [facility] Walk-in ONLY) Medical Exam completed: Yes  Crisis Care Plan Living Arrangements: Non-relatives/Friends Legal Guardian: (NA) Name of Psychiatrist: None Name of Therapist: None  Education Status Is patient currently in school?: No Is the patient employed, unemployed or receiving disability?: Unemployed  Risk to self with the past 6 months Suicidal Ideation: Yes-Currently Present Has patient been a risk to self within the past 6 months prior to admission? : No Suicidal Intent: No Has patient had any suicidal intent within the past 6 months prior to admission? : No Is patient at risk for suicide?: Yes Suicidal Plan?: No Has patient had any suicidal plan within the past 6 months prior to admission? : No Access to Means: No What has been your use of  drugs/alcohol within the last 12 months?: Current use Previous Attempts/Gestures: No How many times?: 0 Other Self Harm Risks: (Excessive SA use) Triggers for Past Attempts: (NA) Intentional Self Injurious Behavior: None Family Suicide History: No Recent stressful life event(s): Other (Comment)(Excessive SA use) Persecutory voices/beliefs?: No Depression: Yes Depression Symptoms: Guilt, Feeling worthless/self pity Substance abuse history and/or treatment for substance abuse?: Yes Suicide prevention information given to non-admitted patients: Not applicable  Risk to Others within the past 6 months Homicidal Ideation: No Does patient have any lifetime risk of violence toward others beyond the six months prior to admission? : No Thoughts of Harm to Others: No Current Homicidal Intent: No Current Homicidal Plan: No Access to Homicidal Means: No Identified Victim: NA History of harm to others?: No Assessment of Violence: None Noted Violent Behavior Description: NA Does patient have access to weapons?: No Criminal Charges Pending?: No Does patient have a court date: No Is patient on probation?: No  Psychosis Hallucinations: Auditory, Visual Delusions: None noted, Persecutory  Mental Status Report Appearance/Hygiene: Unremarkable Eye Contact: Fair Motor Activity: Freedom of movement Speech: Pressured Level of Consciousness: Irritable Mood: Anxious Affect: Angry Anxiety Level: Moderate Thought Processes: Thought Blocking Judgement: Impaired Orientation: (UTA) Obsessive Compulsive Thoughts/Behaviors: None  Cognitive Functioning Concentration: Decreased Memory: Recent  Impaired Is patient IDD: No Insight: Fair Impulse Control: Poor Appetite: Fair Have you had any weight changes? : No Change Sleep: No Change Total Hours of Sleep: 6 Vegetative Symptoms: None  ADLScreening [facility] Assessment Services) Patient's cognitive ability adequate to safely complete daily  activities?: Yes Patient able to express need for assistance with ADLs?: Yes Independently performs ADLs?: Yes (appropriate for developmental age)  Prior Inpatient Therapy Prior Inpatient Therapy: Yes Prior Therapy Dates: 2020 Prior Therapy Facilty/Provider(s): [facility] Reason for Treatment: MH issues  Prior Outpatient Therapy Prior Outpatient Therapy: No Does patient have an ACCT team?: No Does patient have Intensive In-House Services?  : No Does patient have [facility] services? : No Does patient have P4CC services?: No  ADL Screening (condition at time of admission) Patient's cognitive ability adequate to safely complete daily activities?: Yes Is the patient deaf or have difficulty hearing?: No Does the patient have difficulty seeing, even when wearing glasses/contacts?: No Does the patient have difficulty concentrating, remembering, or making decisions?: No Patient able to express need for assistance with ADLs?: Yes Does the patient have difficulty dressing or bathing?: No Independently performs ADLs?: Yes (appropriate for developmental age) Does the patient have difficulty walking or climbing stairs?: No Weakness of Legs: None Weakness of Arms/Hands: None  Home Assistive Devices/Equipment Home Assistive Devices/Equipment: None  Therapy Consults (therapy consults require a physician order) PT Evaluation Needed: No OT Evalulation Needed: No SLP Evaluation Needed: No Abuse/Neglect Assessment (Assessment to be complete while patient is alone) Physical Abuse: Denies, provider concerned (Comment) Verbal Abuse: Denies, provider concerned (Comment) Sexual Abuse: Denies, provider concered (Comment) Exploitation of patient/patient's resources: Denies, provider concerned (Comment) Self-Neglect: Denies Values / Beliefs Cultural Requests During Hospitalization: None Spiritual Requests During Hospitalization: None Consults Spiritual Care Consult Needed: No Social Work  Consult Needed: No Merchant navy officer (For Healthcare) Does Patient Have a Medical Advance Directive?: No Would patient like information on creating a medical advance directive?: No - Patient declined          Disposition: Case was staffed with Maisie Fus NP who recommended patient be observed and monitored after being medically cleared.   Disposition Initial Assessment Completed for this Encounter: Yes Disposition of Patient: Admit Type of inpatient treatment program: Adult Patient refused recommended treatment: No Mode of transportation if patient is discharged/movement?: Loreli Slot)  On Site Evaluation by:   Reviewed with Physician:    Alfredia Ferguson 01/04/2019 12:24 PM

## 2019-01-04 NOTE — ED Provider Notes (Signed)
[facility]-EMERGENCY DEPT Provider Note   CSN: [id number] Arrival date & time: 01/04/19  1257    History   Chief Complaint Chief Complaint  Patient presents with  . Suicidal    HPI Randall Simpson is a 53 y.o. male with history of alcohol abuse, atrial fibrillation, CAD, DVT, MI presenting for evaluation of worsening suicidal thoughts, dysphoric mood, and alcohol abuse.  Reports he has had "several" bottles of wine today.  Reports feelings of worthlessness and unhappiness regarding his lifestyle.  States that he has had worsening suicidal thoughts but no definitive plan.  Denies homicidal ideation.  Reports that he often hears voices with command hallucination.  Denies visual hallucinations.  He is a current smoker, denies recreational drug use.  Denies any medical complaints at this time including fever, chills, chest pain, shortness of breath, abdominal pain, cough, nausea, or vomiting.     The history is provided by the patient.    Past Medical History:  Diagnosis Date  . Alcohol withdrawal (H[facility]) 08/14/2018  . Alcohol withdrawal syndrome, with delirium ([facility]) 08/03/2018  . Atrial fibrillation (H[facility])   . Carotid artery disease (HCC)   . DVT (deep venous thrombosis) (H[facility])   . ETOH abuse   . MI (myocardial infarction) ([facility]) 06/02/2017   [place]    Patient Active Problem List   Diagnosis Date Noted  . COPD with chronic bronchitis (HCC) 12/14/2018  . Plantar fasciitis 12/14/2018  . Alcohol use with alcohol-induced mood disorder (HCC) 11/17/2018  . Single subsegmental pulmonary embolism without acute cor pulmonale 09/02/2018  . Pure hypercholesterolemia 09/02/2018  . DVT (deep venous thrombosis) (HCC) 08/12/2018  . Alcohol abuse 08/12/2018  . Tobacco abuse 08/12/2018  . Coronary artery disease 08/03/2018  . History of MI (myocardial infarction) 06/02/2017    Past Surgical History:  Procedure Laterality Date  . SHOULDER SURGERY          Home  Medications    Prior to Admission medications   Medication Sig Start Date End Date Taking? Authorizing Provider  acetaminophen (TYLENOL) 325 MG tablet Take 650 mg by mouth daily.   Yes [provider]  albuterol (PROVENTIL HFA;VENTOLIN HFA) 108 (90 Base) MCG/ACT inhaler Inhale 2 puffs into the lungs every 6 (six) hours as needed for wheezing or shortness of breath.   Yes [provider]  apixaban (ELIQUIS) 5 MG TABS tablet Take 1 tablet (5 mg total) by mouth 2 (two) times daily. 12/14/18  Yes [name], MD  aspirin 81 MG chewable tablet Chew by mouth daily.   Yes [provider]  atorvastatin (LIPITOR) 20 MG tablet Take 1 tablet (20 mg total) by mouth daily. 12/07/18  Yes [name], MD  budesonide-formoterol [facility]) 160-4.5 MCG/ACT inhaler Inhale 2 puffs into the lungs 2 (two) times daily. 12/14/18 12/14/19 Yes [name], MD  COENZYME Q10 PO Take 1 capsule by mouth daily.   Yes [provider]  GARLIC PO Take 1 capsule by mouth daily.   Yes [provider]  Multiple Vitamin (MULTIVITAMIN WITH MINERALS) TABS tablet Take 1 tablet by mouth daily.   Yes [provider]  traZODone (DESYREL) 100 MG tablet Take 1 tablet (100 mg total) by mouth at bedtime as needed for sleep. 12/14/18  Yes [name], MD    Family History Family History  Problem Relation Age of Onset  . Cancer Mother   . Cancer Father     Social History Social History   Tobacco  Use  . Smoking status: Current Every Day Smoker    Packs/day: 0.50    Types: Cigarettes  . Smokeless tobacco: Never Used  Substance Use Topics  . Alcohol use: Yes    Comment: daily use-8 pack of wine and several pints of beer and vodka   . Drug use: Never     Allergies   Xarelto [rivaroxaban]   Review of Systems Review of Systems  Constitutional: Negative for chills and fever.  Respiratory: Negative for cough and shortness of breath.   Cardiovascular:  Negative for chest pain.  Gastrointestinal: Negative for abdominal pain, nausea and vomiting.  Psychiatric/Behavioral: Positive for dysphoric mood and suicidal ideas.  All other systems reviewed and are negative.    Physical Exam Updated Vital Signs BP 109/77 (BP Location: Right Arm)   Pulse 67   Temp 98 F (36.7 C)   Resp 18   Ht 5\' 10"  (1.778 m)   Wt 79.4 kg   SpO2 97%   BMI 25.11 kg/m   Physical Exam Vitals signs and nursing note reviewed.  Constitutional:      General: He is not in acute distress.    Appearance: He is well-developed.     Comments: Resting in bed  HENT:     Head: Normocephalic and atraumatic.  Eyes:     General:        Right eye: No discharge.        Left eye: No discharge.     Conjunctiva/sclera: Conjunctivae normal.  Neck:     Musculoskeletal: Normal range of motion and neck supple.     Vascular: No JVD.     Trachea: No tracheal deviation.  Cardiovascular:     Rate and Rhythm: Normal rate and regular rhythm.  Pulmonary:     Effort: Pulmonary effort is normal.     Breath sounds: Normal breath sounds.  Abdominal:     General: Abdomen is flat. Bowel sounds are normal. There is no distension.     Palpations: Abdomen is soft.     Tenderness: There is no abdominal tenderness. There is no guarding or rebound.  Skin:    General: Skin is warm and dry.     Findings: No erythema.  Neurological:     Mental Status: He is alert.     Comments: Mildly dysarthric speech, but patient oriented to person place and time.  No facial droop.  Cranial nerves II through XII appear grossly intact.  Moves extremity spontaneously with intact strength.  Sensation intact to soft touch of upper and lower extremities.  Ambulatory with steady gait.  Psychiatric:        Mood and Affect: Mood is depressed.        Behavior: Behavior is cooperative.        Thought Content: Thought content includes suicidal ideation. Thought content does not include homicidal ideation. Thought  content does not include homicidal or suicidal plan.      ED Treatments / Results  Labs (all labs ordered are listed, but only abnormal results are displayed) Labs Reviewed  ETHANOL - Abnormal; Notable for the following components:      Result Value   Alcohol, Ethyl (B) 208 (*)    All other components within normal limits  CBC WITH DIFFERENTIAL/PLATELET - Abnormal; Notable for the following components:   RDW 15.6 (*)    All other components within normal limits  ACETAMINOPHEN LEVEL - Abnormal; Notable for the following components:   Acetaminophen (Tylenol), Serum <10 (*)  All other components within normal limits  SARS CORONAVIRUS 2 (HOSPITAL ORDER, PERFORMED IN [facility])  COMPREHENSIVE METABOLIC PANEL  RAPID URINE DRUG SCREEN, HOSP PERFORMED  SALICYLATE LEVEL    EKG None  Radiology No results found.  Procedures Procedures (including critical care time)  Medications Ordered in ED Medications  LORazepam (ATIVAN) injection 0-4 mg (has no administration in time range)    Or  LORazepam (ATIVAN) tablet 0-4 mg (has no administration in time range)  LORazepam (ATIVAN) injection 0-4 mg (has no administration in time range)    Or  LORazepam (ATIVAN) tablet 0-4 mg (has no administration in time range)  thiamine (VITAMIN B-1) tablet 100 mg (has no administration in time range)    Or  thiamine (B-1) injection 100 mg (has no administration in time range)  acetaminophen (TYLENOL) tablet 650 mg (has no administration in time range)  ondansetron (ZOFRAN) tablet 4 mg (has no administration in time range)  alum & mag hydroxide-simeth (MAALOX/MYLANTA) 200-200-20 MG/5ML suspension 30 mL (has no administration in time range)  nicotine (NICODERM CQ - dosed in mg/24 hours) patch 21 mg (has no administration in time range)  albuterol (VENTOLIN HFA) 108 (90 Base) MCG/ACT inhaler 2 puff (has no administration in time range)  apixaban (ELIQUIS) tablet 5 mg (has no  administration in time range)  atorvastatin (LIPITOR) tablet 20 mg (has no administration in time range)  traZODone (DESYREL) tablet 100 mg (has no administration in time range)     Initial Impression / Assessment and Plan / ED Course  I have reviewed the triage vital signs and the nursing notes.  Pertinent labs & imaging results that were available during my care of the patient were reviewed by me and considered in my medical decision making (see chart for details).        Patient presenting for evaluation of suicidal ideation, alcohol abuse.  He is afebrile, vital signs are stable.  He is nontoxic in appearance.  Appears somewhat intoxicated but ambulatory without difficulty, oriented to person place and time.  Physical examination overall reassuring.  His ethanol level is elevated at 208.  Doubt that he is withdrawing from alcohol given EtOH level.  Remainder physical examination is reassuring and screening labs reviewed by myself show no leukocytosis, no anemia, no metabolic derangements, no renal insufficiency.  COVID test is pending.  He is medically cleared for TTS evaluation at this time.  Of note, patient is here voluntarily and may require IVC if he attempts to leave prior to psychiatric assessment.  Final Clinical Impressions(s) / ED Diagnoses   Final diagnoses:  Suicidal ideation  Alcohol abuse    ED Discharge Orders    None       Bennye AlmFawze, Aradhana Gin A, PA-C 01/04/19 1539    Azalia Bilisampos, Kevin, MD 01/04/19 1553

## 2019-01-04 NOTE — Patient Outreach (Signed)
ED Peer Support Specialist Patient Intake (Complete at intake & 30-60 Day Follow-up)  Name: Randall Simpson  MRN: [id number]  Age: 53 y.o.   Date of Admission: 01/04/2019  Intake: Initial Comments:      Primary Reason Admitted: SI, depression, alcohol use  Lab values: Alcohol/ETOH: Positive Positive UDS? No Amphetamines: No Barbiturates: No Benzodiazepines: No Cocaine: No Opiates: No Cannabinoids: No  Demographic information: Gender: Male Ethnicity: White Marital Status: Single Insurance Status: Uninsured/Self-pay Ecologist (Work Neurosurgeon, Physicist, medical, etc.: [employer]) Lives with: Friend/Rommate Living situation: House/Apartment  Reported Patient History: Patient reported health conditions: COPD, Depression Patient aware of HIV and hepatitis status:    In past year, has patient visited ED for any reason? Yes  Number of ED visits: 6  Reason(s) for visit: dizziness, acute pain of left shoulder, alcohol withdrawals, cough/SOB, depression, alcohol use, bilateral calf pain, leg edema  In past year, has patient been hospitalized for any reason? Yes  Number of hospitalizations: 1  Reason(s) for hospitalization: alcohol withdrawals  In past year, has patient been arrested? No  Number of arrests:    Reason(s) for arrest:    In past year, has patient been incarcerated? No  Number of incarcerations:    Reason(s) for incarceration:    In past year, has patient received medication-assisted treatment? No  In past year, patient received the following treatments:    In past year, has patient received any harm reduction services? No  Did this include any of the following?    In past year, has patient received care from a mental health provider for diagnosis other than SUD? No  In past year, is this first time patient has overdosed? (has not overdosed in the past year)  Number of past overdoses:    In past year, is this first time  patient has been hospitalized for an overdose? (has not overdosed in the past year)  Number of hospitalizations for overdose(s):    Is patient currently receiving treatment for a mental health diagnosis? No  Patient reports experiencing difficulty participating in SUD treatment: No    Most important reason(s) for this difficulty?    Has patient received prior services for treatment? No  In past, patient has received services from following agencies:    Plan of Care:  Suggested follow up at these agencies/treatment centers: Patient is being observed and monitored overnight. Patient will be seen by psychiatry in the a.m.  Other information: [facility] met with the patient in order to provide substance use recovery support and help with getting connected to substance use treatment resources. [facility]PSS provided [facility] contact information. [facility] strongly encouraged the patient to stay in contact with [facility] for further substance use recovery support if needed.    Mason Jim, [place]  01/04/2019 4:07 PM

## 2019-01-05 ENCOUNTER — Ambulatory Visit (HOSPITAL_COMMUNITY): Payer: Self-pay

## 2019-01-05 ENCOUNTER — Encounter (HOSPITAL_COMMUNITY): Payer: Self-pay | Admitting: Registered Nurse

## 2019-01-05 DIAGNOSIS — F1094 Alcohol use, unspecified with alcohol-induced mood disorder: Secondary | ICD-10-CM

## 2019-01-05 NOTE — Progress Notes (Signed)
Discharge note : Discharge instructions/medications/follow up appointments discussed with pt. Belongings returned to pt.   Pt verbalizes understanding and will return to sober living. Pt transported self home..  Pt denies SI/HI/AVH.

## 2019-01-05 NOTE — Progress Notes (Signed)
[place] NOVEL CORONAVIRUS (COVID-19) DAILY CHECK-OFF SYMPTOMS - answer yes or no to each - every day NO YES  Have you had a fever in the past 24 hours?  . Fever (Temp > 37.80C / 100F) X   Have you had any of these symptoms in the past 24 hours? . New Cough .  Sore Throat  .  Shortness of Breath .  Difficulty Breathing .  Unexplained Body Aches   X   Have you had any one of these symptoms in the past 24 hours not related to allergies?   . Runny Nose .  Nasal Congestion .  Sneezing   X   If you have had runny nose, nasal congestion, sneezing in the past 24 hours, has it worsened?  X   EXPOSURES - check yes or no X   Have you traveled outside the state in the past 14 days?  X   Have you been in contact with someone with a confirmed diagnosis of COVID-19 or PUI in the past 14 days without wearing appropriate PPE?  X   Have you been living in the same home as a person with confirmed diagnosis of COVID-19 or a PUI (household contact)?    X   Have you been diagnosed with COVID-19?    X              What to do next: Answered NO to all: Answered YES to anything:   Proceed with unit schedule Follow the [facility] Inpatient Flowsheet.   

## 2019-01-05 NOTE — Patient Outreach (Signed)
CPSS met with the patient in the [facility] observation unit in order to provide further substance use recovery support and help with getting connected to substance use treatment resources. CPSS met with the patient yesterday in the [facility] 01/04/19. Patient reports a history of alcohol addiction. Patient plans to follow up with their currently living situation at the [facility] halfway house program. CPSS provided information for outpatient substance use treatment resources as well as another card with CPSS contact information.

## 2019-01-05 NOTE — Discharge Summary (Addendum)
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 Mariea Clonts, and chart reviewed on 01/05/2019.  On evaluation Antwane Grose reports that he is feeling better now.  States that he was drunk when he came in and that he is not having any thoughts of wanting to hurt himself or anyone else.  Patient also denies psychosis, and paranoia.  States that he is staying at [facility] and that he has been sober for 2 months but relapsed yesterday.  States that he is staying with 7 other guys that are also sober and should have talked to someone yesterday.  Patient reports that he is able to go back to [facility] and that he feels safe.   Total Time spent with patient: 30 minutes  Past Psychiatric History: MDD, alcohol use disorder  Past Medical History:  Past Medical History:  Diagnosis Date  . Alcohol withdrawal ([place]) 08/14/2018  . Alcohol withdrawal syndrome, with delirium ([place]) 08/03/2018  . Atrial fibrillation ([place])   . Carotid artery disease ([place])   . DVT (deep venous thrombosis) ([place])   . ETOH abuse   . MI (myocardial infarction) ([place]) 06/02/2017   [place]    Past Surgical History:  Procedure Laterality Date  . SHOULDER SURGERY     Family History:  Family History  Problem Relation Age of Onset  . Cancer Mother   . Cancer Father    Family Psychiatric  History: Denies Social History:  Social History   Substance and Sexual Activity  Alcohol Use Yes   Comment: daily use-8 pack of wine and several pints of beer and vodka      Social History   Substance and  Sexual Activity  Drug Use Never    Social History   Socioeconomic History  . Marital status: Single    Spouse name: Not on file  . Number of children: Not on file  . Years of education: Not on file  . Highest education level: Not on file  Occupational History  . Not on file  Social Needs  . Financial resource strain: Not on file  . Food insecurity    Worry: Not on file    Inability: Not on file  . Transportation needs    Medical: Not on file    Non-medical: Not on file  Tobacco Use  . Smoking status: Current Every Day Smoker    Packs/day: 0.50    Types: Cigarettes  . Smokeless tobacco: Never Used  Substance and Sexual Activity  . Alcohol use: Yes    Comment: daily use-8 pack of wine and several pints of beer and vodka   . Drug use: Never  . Sexual activity: Not Currently  Lifestyle  . Physical activity    Days per week: Not on file    Minutes per session: Not on file  . Stress: Not on file  Relationships  . Social Herbalist on phone: Not on file    Gets together: Not on file    Attends religious service: Not on file    Active member of club or organization: Not on file    Attends  meetings of clubs or organizations: Not on file    Relationship status: Not on file  Other Topics Concern  . Not on file  Social History Narrative  . Not on file    Has this patient used any form of tobacco in the last 30 days? (Cigarettes, Smokeless Tobacco, Cigars, and/or Pipes) A prescription for an FDA-approved tobacco cessation medication was offered at discharge and the patient refused  Current Medications: Current Facility-Administered Medications  Medication Dose Route Frequency Provider Last Rate Last Dose  . acetaminophen (TYLENOL) tablet 650 mg  650 mg Oral Q6H PRN Denzil Magnuson, NP      . albuterol (VENTOLIN HFA) 108 (90 Base) MCG/ACT inhaler 2 puff  2 puff Inhalation Q6H PRN Nira Conn A, NP   2 puff at 01/04/19 2112  . alum & mag hydroxide-simeth  (MAALOX/MYLANTA) 200-200-20 MG/5ML suspension 30 mL  30 mL Oral Q4H PRN Denzil Magnuson, NP      . apixaban (ELIQUIS) tablet 5 mg  5 mg Oral BID Nira Conn A, NP   5 mg at 01/05/19 0837  . atorvastatin (LIPITOR) tablet 20 mg  20 mg Oral Daily Nira Conn A, NP   20 mg at 01/05/19 3546  . hydrOXYzine (ATARAX/VISTARIL) tablet 25 mg  25 mg Oral Q6H PRN Denzil Magnuson, NP      . loperamide (IMODIUM) capsule 2-4 mg  2-4 mg Oral PRN Denzil Magnuson, NP      . LORazepam (ATIVAN) tablet 1 mg  1 mg Oral Q6H PRN Denzil Magnuson, NP   1 mg at 01/05/19 5681  . mometasone-formoterol (DULERA) 200-5 MCG/ACT inhaler 2 puff  2 puff Inhalation BID Nira Conn A, NP   2 puff at 01/05/19 [phone]  . multivitamin with minerals tablet 1 tablet  1 tablet Oral Daily Denzil Magnuson, NP   1 tablet at 01/05/19 [phone]  . ondansetron (ZOFRAN-ODT) disintegrating tablet 4 mg  4 mg Oral Q6H PRN Denzil Magnuson, NP      . thiamine (B-1) injection 100 mg  100 mg Intramuscular Once Denzil Magnuson, NP      . thiamine (VITAMIN B-1) tablet 100 mg  100 mg Oral Daily Denzil Magnuson, NP   100 mg at 01/05/19 7494  . traZODone (DESYREL) tablet 100 mg  100 mg Oral QHS PRN Nira Conn A, NP   100 mg at 01/04/19 2113   PTA Medications: Medications Prior to Admission  Medication Sig Dispense Refill Last Dose  . albuterol (PROVENTIL HFA;VENTOLIN HFA) 108 (90 Base) MCG/ACT inhaler Inhale 2 puffs into the lungs every 6 (six) hours as needed for wheezing or shortness of breath.   01/04/2019 at Unknown time  . apixaban (ELIQUIS) 5 MG TABS tablet Take 1 tablet (5 mg total) by mouth 2 (two) times daily. 60 tablet 3 01/04/2019 at Unknown time  . atorvastatin (LIPITOR) 20 MG tablet Take 1 tablet (20 mg total) by mouth daily. 90 tablet 3 01/04/2019 at Unknown time  . budesonide-formoterol (SYMBICORT) 160-4.5 MCG/ACT inhaler Inhale 2 puffs into the lungs 2 (two) times daily. 1 Inhaler 12 01/04/2019 at Unknown time  . COENZYME Q10 PO Take 1 capsule by  mouth daily.   01/04/2019 at Unknown time  . GARLIC PO Take 1 capsule by mouth daily.   01/04/2019 at Unknown time  . acetaminophen (TYLENOL) 325 MG tablet Take 650 mg by mouth daily.     Marland Kitchen aspirin 81 MG chewable tablet Chew by mouth daily.     . Multiple Vitamin (MULTIVITAMIN WITH  MINERALS) TABS tablet Take 1 tablet by mouth daily.     . traZODone (DESYREL) 100 MG tablet Take 1 tablet (100 mg total) by mouth at bedtime as needed for sleep. 30 tablet 6     Musculoskeletal: Strength & Muscle Tone: within normal limits Gait & Station: normal Patient leans: N/A  Psychiatric Specialty Exam: Physical Exam  Nursing note and vitals reviewed. Constitutional: He is oriented to person, place, and time. He appears well-developed and well-nourished. No distress.  Neck: Normal range of motion.  Respiratory: Effort normal.  Musculoskeletal: Normal range of motion.  Neurological: He is alert and oriented to person, place, and time.  Skin: Skin is warm and dry.  Psychiatric: He has a normal mood and affect. His behavior is normal. Judgment and thought content normal.    Review of Systems  Psychiatric/Behavioral: Depression: Stable. Hallucinations: Denies. Substance abuse: ETOH. Suicidal ideas: Denies. Nervous/anxious: Stable. Insomnia: Denies.   All other systems reviewed and are negative.   Blood pressure 103/71, pulse (!) 119, temperature 98.2 F (36.8 C), temperature source Oral, resp. rate 17, SpO2 98 %.There is no height or weight on file to calculate BMI.  General Appearance: Casual  Eye Contact:  Good  Speech:  Clear and Coherent and Normal Rate  Volume:  Normal  Mood:  Appropriate  Affect:  Appropriate  Thought Process:  Coherent and Goal Directed  Orientation:  Full (Time, Place, and Person)  Thought Content:  WDL  Suicidal Thoughts:  No  Homicidal Thoughts:  No  Memory:  Immediate;   Good Recent;   Good Remote;   Good  Judgement:  Intact  Insight:  Present  Psychomotor Activity:   Normal  Concentration:  Concentration: Good and Attention Span: Good  Recall:  Good  Fund of Knowledge:  Good  Language:  Good  Akathisia:  No  Handed:  Right  AIMS (if indicated):   N/A  Assets:  Communication Skills Desire for Improvement Housing Social Support  ADL's:  Intact  Cognition:  WNL  Sleep:   N/A     Demographic Factors:  Male and Caucasian  Loss Factors: NA  Historical Factors: NA  Risk Reduction Factors:   Positive social support and Positive therapeutic relationship  Continued Clinical Symptoms:  Previous Psychiatric Diagnoses and Treatments  Cognitive Features That Contribute To Risk:  None    Suicide Risk:  Minimal: No identifiable suicidal ideation.  Patients presenting with no risk factors but with morbid ruminations; may be classified as minimal risk based on the severity of the depressive symptoms    Plan Of Care/Follow-up recommendations:  Activity:  As tolerated Diet:  Heart healthy Other:  Follow up with resources given  Disposition: No evidence of imminent risk to self or others at present.   Patient does not meet criteria for psychiatric inpatient admission. Supportive therapy provided about ongoing stressors. Discussed crisis plan, support from social network, calling 911, coming to the Emergency Department, and calling Suicide Hotline.   Shuvon Rankin, NP 01/05/2019, 12:02 PM    Patient seen face-to-face for psychiatric evaluation, chart reviewed and case discussed with the physician extender and developed treatment plan. Reviewed the information documented and agree with the treatment plan.  Juanetta BeetsJacqueline Jylan Loeza, DO 01/05/19 2:28 PM

## 2019-01-05 NOTE — BH Assessment (Signed)
[facility] Assessment Progress Note  Per Buford Dresser, DO, this pt does not require psychiatric hospitalization at this time.  Pt is to be discharged from the [facility] Observation Unit.  He is to return to [place].  No further discharge instructions are indicated.  Pt's nurse has been notified.  Jalene Mullet, [place] Triage Specialist [phone]

## 2019-01-23 ENCOUNTER — Other Ambulatory Visit: Payer: Self-pay

## 2019-01-23 ENCOUNTER — Ambulatory Visit (HOSPITAL_COMMUNITY)
Admission: RE | Admit: 2019-01-23 | Discharge: 2019-01-23 | Disposition: A | Discharge status: Home / Self Care | Payer: Self-pay | Attending: Psychiatry | Admitting: Psychiatry

## 2019-01-23 ENCOUNTER — Encounter (HOSPITAL_COMMUNITY): Payer: Self-pay

## 2019-01-23 ENCOUNTER — Inpatient Hospital Stay (HOSPITAL_COMMUNITY)
Admission: EM | Admit: 2019-01-23 | Discharge: 2019-01-26 | DRG: 381 | Disposition: A | Discharge status: Home / Self Care | Payer: Self-pay | Attending: Internal Medicine | Admitting: Internal Medicine

## 2019-01-23 DIAGNOSIS — I4891 Unspecified atrial fibrillation: Secondary | ICD-10-CM | POA: Diagnosis present

## 2019-01-23 DIAGNOSIS — Z7951 Long term (current) use of inhaled steroids: Secondary | ICD-10-CM

## 2019-01-23 DIAGNOSIS — Z809 Family history of malignant neoplasm, unspecified: Secondary | ICD-10-CM

## 2019-01-23 DIAGNOSIS — E7800 Pure hypercholesterolemia, unspecified: Secondary | ICD-10-CM | POA: Diagnosis present

## 2019-01-23 DIAGNOSIS — Z86718 Personal history of other venous thrombosis and embolism: Secondary | ICD-10-CM

## 2019-01-23 DIAGNOSIS — D62 Acute posthemorrhagic anemia: Secondary | ICD-10-CM | POA: Diagnosis present

## 2019-01-23 DIAGNOSIS — F1010 Alcohol abuse, uncomplicated: Secondary | ICD-10-CM | POA: Diagnosis present

## 2019-01-23 DIAGNOSIS — E876 Hypokalemia: Secondary | ICD-10-CM | POA: Diagnosis present

## 2019-01-23 DIAGNOSIS — F10929 Alcohol use, unspecified with intoxication, unspecified: Secondary | ICD-10-CM

## 2019-01-23 DIAGNOSIS — K920 Hematemesis: Secondary | ICD-10-CM

## 2019-01-23 DIAGNOSIS — Z720 Tobacco use: Secondary | ICD-10-CM | POA: Diagnosis present

## 2019-01-23 DIAGNOSIS — I82409 Acute embolism and thrombosis of unspecified deep veins of unspecified lower extremity: Secondary | ICD-10-CM | POA: Diagnosis present

## 2019-01-23 DIAGNOSIS — I252 Old myocardial infarction: Secondary | ICD-10-CM

## 2019-01-23 DIAGNOSIS — K2900 Acute gastritis without bleeding: Secondary | ICD-10-CM | POA: Diagnosis present

## 2019-01-23 DIAGNOSIS — I2693 Single subsegmental pulmonary embolism without acute cor pulmonale: Secondary | ICD-10-CM | POA: Diagnosis present

## 2019-01-23 DIAGNOSIS — K2211 Ulcer of esophagus with bleeding: Principal | ICD-10-CM | POA: Diagnosis present

## 2019-01-23 DIAGNOSIS — K922 Gastrointestinal hemorrhage, unspecified: Secondary | ICD-10-CM | POA: Diagnosis present

## 2019-01-23 DIAGNOSIS — Z20828 Contact with and (suspected) exposure to other viral communicable diseases: Secondary | ICD-10-CM | POA: Diagnosis present

## 2019-01-23 DIAGNOSIS — Z86711 Personal history of pulmonary embolism: Secondary | ICD-10-CM

## 2019-01-23 DIAGNOSIS — F1020 Alcohol dependence, uncomplicated: Secondary | ICD-10-CM | POA: Diagnosis present

## 2019-01-23 DIAGNOSIS — I2510 Atherosclerotic heart disease of native coronary artery without angina pectoris: Secondary | ICD-10-CM | POA: Diagnosis present

## 2019-01-23 DIAGNOSIS — Z7901 Long term (current) use of anticoagulants: Secondary | ICD-10-CM

## 2019-01-23 DIAGNOSIS — F17210 Nicotine dependence, cigarettes, uncomplicated: Secondary | ICD-10-CM | POA: Diagnosis present

## 2019-01-23 DIAGNOSIS — E860 Dehydration: Secondary | ICD-10-CM

## 2019-01-23 DIAGNOSIS — K210 Gastro-esophageal reflux disease with esophagitis: Secondary | ICD-10-CM | POA: Diagnosis present

## 2019-01-23 DIAGNOSIS — K449 Diaphragmatic hernia without obstruction or gangrene: Secondary | ICD-10-CM | POA: Diagnosis present

## 2019-01-23 DIAGNOSIS — Z888 Allergy status to other drugs, medicaments and biological substances status: Secondary | ICD-10-CM

## 2019-01-23 DIAGNOSIS — I8500 Esophageal varices without bleeding: Secondary | ICD-10-CM | POA: Diagnosis present

## 2019-01-23 LAB — CBC WITH DIFFERENTIAL/PLATELET
Abs Immature Granulocytes: 0.06 10*3/uL (ref 0.00–0.07)
Basophils Absolute: 0 10*3/uL (ref 0.0–0.1)
Basophils Relative: 0 %
Eosinophils Absolute: 0 10*3/uL (ref 0.0–0.5)
Eosinophils Relative: 0 %
HCT: 47.7 % (ref 39.0–52.0)
Hemoglobin: 16.1 g/dL (ref 13.0–17.0)
Immature Granulocytes: 0 %
Lymphocytes Relative: 9 %
Lymphs Abs: 1.4 10*3/uL (ref 0.7–4.0)
MCH: 28.2 pg (ref 26.0–34.0)
MCHC: 33.8 g/dL (ref 30.0–36.0)
MCV: 83.7 fL (ref 80.0–100.0)
Monocytes Absolute: 1.1 10*3/uL — ABNORMAL HIGH (ref 0.1–1.0)
Monocytes Relative: 7 %
Neutro Abs: 13.5 10*3/uL — ABNORMAL HIGH (ref 1.7–7.7)
Neutrophils Relative %: 84 %
Platelets: 297 10*3/uL (ref 150–400)
RBC: 5.7 MIL/uL (ref 4.22–5.81)
RDW: 15.6 % — ABNORMAL HIGH (ref 11.5–15.5)
WBC: 16.1 10*3/uL — ABNORMAL HIGH (ref 4.0–10.5)
nRBC: 0 % (ref 0.0–0.2)

## 2019-01-23 LAB — COMPREHENSIVE METABOLIC PANEL
ALT: 19 U/L (ref 0–44)
AST: 22 U/L (ref 15–41)
Albumin: 4.6 g/dL (ref 3.5–5.0)
Alkaline Phosphatase: 90 U/L (ref 38–126)
Anion gap: 25 — ABNORMAL HIGH (ref 5–15)
BUN: 31 mg/dL — ABNORMAL HIGH (ref 6–20)
CO2: 31 mmol/L (ref 22–32)
Calcium: 8.8 mg/dL — ABNORMAL LOW (ref 8.9–10.3)
Chloride: 80 mmol/L — ABNORMAL LOW (ref 98–111)
Creatinine, Ser: 1.39 mg/dL — ABNORMAL HIGH (ref 0.61–1.24)
GFR calc Af Amer: >60 mL/min (ref >60)
GFR calc non Af Amer: 57 mL/min — ABNORMAL LOW (ref >60)
Glucose, Bld: 141 mg/dL — ABNORMAL HIGH (ref 70–99)
Potassium: 2.8 mmol/L — ABNORMAL LOW (ref 3.5–5.1)
Sodium: 136 mmol/L (ref 135–145)
Total Bilirubin: 2.7 mg/dL — ABNORMAL HIGH (ref 0.3–1.2)
Total Protein: 8.4 g/dL — ABNORMAL HIGH (ref 6.5–8.1)

## 2019-01-23 LAB — HEMOGLOBIN AND HEMATOCRIT, BLOOD
HCT: 43.2 % (ref 39.0–52.0)
Hemoglobin: 14.2 g/dL (ref 13.0–17.0)

## 2019-01-23 LAB — TYPE AND SCREEN
ABO/RH(D): O POS
Antibody Screen: NEGATIVE

## 2019-01-23 LAB — POC OCCULT BLOOD, ED: Fecal Occult Bld: NEGATIVE

## 2019-01-23 LAB — APTT: aPTT: 26 seconds (ref 24–36)

## 2019-01-23 LAB — SARS CORONAVIRUS 2 BY RT PCR (HOSPITAL ORDER, PERFORMED IN ~~LOC~~ HOSPITAL LAB): SARS Coronavirus 2: NEGATIVE

## 2019-01-23 LAB — PROTIME-INR
INR: 0.9 (ref 0.8–1.2)
Prothrombin Time: 12.5 seconds (ref 11.4–15.2)

## 2019-01-23 LAB — ETHANOL: Alcohol, Ethyl (B): 131 mg/dL — ABNORMAL HIGH (ref <10)

## 2019-01-23 LAB — MAGNESIUM: Magnesium: 2.6 mg/dL — ABNORMAL HIGH (ref 1.7–2.4)

## 2019-01-23 MED ORDER — ADULT MULTIVITAMIN W/MINERALS CH
1.0000 | ORAL_TABLET | Freq: Every day | ORAL | Status: DC
  Administered 2019-01-23 – 2019-01-26 (×4): 1 via ORAL
  Filled 2019-01-23 (×5): qty 1

## 2019-01-23 MED ORDER — LORAZEPAM 2 MG/ML IJ SOLN: 0.0000 mg | Freq: Four times a day (QID) | INTRAMUSCULAR | Status: AC

## 2019-01-23 MED ORDER — LORAZEPAM 2 MG/ML IJ SOLN: 1.0000 mg | Freq: Four times a day (QID) | INTRAMUSCULAR | Status: DC | PRN

## 2019-01-23 MED ORDER — ACETAMINOPHEN 650 MG RE SUPP: 650.0000 mg | Freq: Four times a day (QID) | RECTAL | Status: DC | PRN

## 2019-01-23 MED ORDER — THIAMINE HCL 100 MG/ML IJ SOLN: 100.0000 mg | Freq: Every day | INTRAMUSCULAR | Status: DC

## 2019-01-23 MED ORDER — LORAZEPAM 2 MG/ML IJ SOLN
1.0000 mg | Freq: Once | INTRAMUSCULAR | Status: AC
  Administered 2019-01-23: 1 mg via INTRAVENOUS
  Filled 2019-01-23: qty 1

## 2019-01-23 MED ORDER — LORAZEPAM 1 MG PO TABS
1.0000 mg | ORAL_TABLET | Freq: Four times a day (QID) | ORAL | Status: DC | PRN
  Administered 2019-01-23 – 2019-01-26 (×6): 1 mg via ORAL
  Filled 2019-01-23 (×6): qty 1

## 2019-01-23 MED ORDER — FOLIC ACID 1 MG PO TABS
1.0000 mg | ORAL_TABLET | Freq: Every day | ORAL | Status: DC
  Administered 2019-01-23 – 2019-01-26 (×4): 1 mg via ORAL
  Filled 2019-01-23 (×4): qty 1

## 2019-01-23 MED ORDER — SODIUM CHLORIDE 0.9 % IV BOLUS
1000.0000 mL | Freq: Once | INTRAVENOUS | Status: AC
  Administered 2019-01-23: 1000 mL via INTRAVENOUS

## 2019-01-23 MED ORDER — SODIUM CHLORIDE 0.9 % IV SOLN
8.0000 mg/h | INTRAVENOUS | Status: DC
  Administered 2019-01-23 – 2019-01-25 (×5): 8 mg/h via INTRAVENOUS
  Filled 2019-01-23 (×7): qty 80

## 2019-01-23 MED ORDER — ONDANSETRON HCL 4 MG/2ML IJ SOLN
4.0000 mg | Freq: Four times a day (QID) | INTRAMUSCULAR | Status: DC | PRN
  Administered 2019-01-24 – 2019-01-25 (×2): 4 mg via INTRAVENOUS
  Filled 2019-01-23 (×2): qty 2

## 2019-01-23 MED ORDER — LORAZEPAM 2 MG/ML IJ SOLN: 0.0000 mg | Freq: Two times a day (BID) | INTRAMUSCULAR | Status: DC

## 2019-01-23 MED ORDER — POTASSIUM CHLORIDE 10 MEQ/100ML IV SOLN
10.0000 meq | INTRAVENOUS | Status: AC
  Administered 2019-01-23 (×3): 10 meq via INTRAVENOUS
  Filled 2019-01-23 (×3): qty 100

## 2019-01-23 MED ORDER — ONDANSETRON HCL 4 MG PO TABS: 4.0000 mg | ORAL_TABLET | Freq: Four times a day (QID) | ORAL | Status: DC | PRN

## 2019-01-23 MED ORDER — SODIUM CHLORIDE 0.9 % IV SOLN
80.0000 mg | Freq: Once | INTRAVENOUS | Status: AC
  Administered 2019-01-23: 80 mg via INTRAVENOUS
  Filled 2019-01-23: qty 80

## 2019-01-23 MED ORDER — ALBUTEROL SULFATE (2.5 MG/3ML) 0.083% IN NEBU: 2.5000 mg | INHALATION_SOLUTION | Freq: Four times a day (QID) | RESPIRATORY_TRACT | Status: DC | PRN

## 2019-01-23 MED ORDER — DEXTROSE-NACL 5-0.45 % IV SOLN
INTRAVENOUS | Status: DC
  Administered 2019-01-23 – 2019-01-25 (×7): via INTRAVENOUS

## 2019-01-23 MED ORDER — VITAMIN B-1 100 MG PO TABS
100.0000 mg | ORAL_TABLET | Freq: Every day | ORAL | Status: DC
  Administered 2019-01-23 – 2019-01-26 (×4): 100 mg via ORAL
  Filled 2019-01-23 (×4): qty 1

## 2019-01-23 MED ORDER — POTASSIUM CHLORIDE CRYS ER 20 MEQ PO TBCR
40.0000 meq | EXTENDED_RELEASE_TABLET | Freq: Two times a day (BID) | ORAL | Status: DC
  Administered 2019-01-23 – 2019-01-26 (×6): 40 meq via ORAL
  Filled 2019-01-23 (×6): qty 2

## 2019-01-23 MED ORDER — TRAZODONE HCL 100 MG PO TABS
100.0000 mg | ORAL_TABLET | Freq: Every evening | ORAL | Status: DC | PRN
  Administered 2019-01-25: 100 mg via ORAL
  Filled 2019-01-23 (×2): qty 1

## 2019-01-23 MED ORDER — ONDANSETRON HCL 4 MG/2ML IJ SOLN
4.0000 mg | Freq: Once | INTRAMUSCULAR | Status: AC
  Administered 2019-01-23: 4 mg via INTRAVENOUS
  Filled 2019-01-23: qty 2

## 2019-01-23 MED ORDER — ACETAMINOPHEN 325 MG PO TABS: 650.0000 mg | ORAL_TABLET | Freq: Four times a day (QID) | ORAL | Status: DC | PRN

## 2019-01-23 NOTE — ED Provider Notes (Signed)
[facility]-EMERGENCY DEPT Provider Note   CSN: [id number] Arrival date & time: 01/23/19  1134     History   Chief Complaint Chief Complaint  Patient presents with   Hematemesis    HPI Randall Simpson is a 53 y.o. male.     Patient c/o vomiting blood onset today. States he was going over to [facility] to inquire about etoh abuse treatment, when he began vomiting blood, states bright red and coffee ground appearance. Pt notes hx esophageal varices. Also is on eliquis for hx dvt/pe. Pt denies headache. No chest pain. No faintness or dizziness. No abd pain. No diarrhea. Denies melena. As relates etoh abuse, notes daily heavy use. Last drank last pm. When stops drinking feels shaky. Denies hx DTs. No known covid+ exposure.   The history is provided by the patient.    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 Authorizing Provider  albuterol (PROVENTIL HFA;VENTOLIN HFA) 108 (90 Base) MCG/ACT inhaler Inhale 2 puffs into the lungs every 6 (six) hours as needed for wheezing or shortness of breath.    [provider]  apixaban (ELIQUIS) 5 MG TABS tablet Take 1 tablet (5 mg total) by mouth 2 (two) times daily. 12/14/18   [name], MD  aspirin 81 MG chewable tablet Chew by mouth daily.    [provider]  atorvastatin (LIPITOR) 20 MG tablet Take 1 tablet (20 mg total) by mouth daily. 12/07/18   [name], MD  budesonide-formoterol (SYMBICORT) 160-4.5 MCG/ACT inhaler Inhale 2 puffs into the lungs 2 (two) times daily. 12/14/18 12/14/19  [name], MD  COENZYME Q10 PO Take 1 capsule by mouth daily.    [provider]  Multiple Vitamin (MULTIVITAMIN WITH MINERALS) TABS tablet Take 1 tablet by mouth daily.    [provider]  traZODone (DESYREL) 100 MG tablet Take 1 tablet (100 mg total) by mouth at bedtime as needed for sleep. 12/14/18   [name], MD    Family History Family History  Problem Relation Age of Onset   Cancer Mother    Cancer Father     Social History Social History   Tobacco Use   Smoking status: Current Every Day Smoker    Packs/day: 0.50    Types: Cigarettes   Smokeless tobacco: Never Used  Substance Use Topics   Alcohol use: Yes    Comment:  daily use-8 pack of wine and several pints of beer and vodka    Drug use: Never     Allergies   Xarelto [rivaroxaban]   Review of Systems Review of Systems  Constitutional: Negative for fever.  HENT: Negative for sore throat.   Eyes: Negative for redness.  Respiratory: Negative for cough and shortness of breath.   Cardiovascular: Negative for chest pain.  Gastrointestinal: Positive for nausea and vomiting. Negative for abdominal pain and blood in stool.  Endocrine: Negative for polyuria.  Genitourinary: Negative for flank pain.  Musculoskeletal: Negative  for back pain and neck pain.  Skin: Negative for rash.  Neurological: Negative for light-headedness and headaches.  Hematological:       +anticoag use.   Psychiatric/Behavioral: Negative for confusion.     Physical Exam Updated Vital Signs BP (!) 139/93    Pulse (!) 113    Temp 98.2 F (36.8 C) (Oral)    Resp 13    Ht 1.778 m (5\' 10" )    Wt 74.8 kg    SpO2 95%    BMI 23.68 kg/m   Physical Exam Vitals signs and nursing note reviewed.  Constitutional:      Appearance: Normal appearance. He is well-developed.  HENT:     Head: Atraumatic.     Nose: Nose normal.     Mouth/Throat:     Mouth: Mucous membranes are moist.     Pharynx: Oropharynx is clear.  Eyes:     General: No scleral icterus.    Conjunctiva/sclera: Conjunctivae normal.     Pupils: Pupils are equal, round, and reactive to light.  Neck:     Musculoskeletal: Normal range of motion and neck supple. No neck rigidity.     Trachea: No tracheal deviation.  Cardiovascular:     Rate and Rhythm: Regular rhythm. Tachycardia present.     Pulses: Normal pulses.     Heart sounds: Normal heart sounds. No murmur. No friction rub. No gallop.   Pulmonary:     Effort: Pulmonary effort is normal. No accessory muscle usage or respiratory distress.     Breath sounds: Normal breath sounds.  Abdominal:     General: Bowel sounds are normal. There is no distension.     Palpations: Abdomen is soft. There is no mass.     Tenderness: There is no abdominal tenderness. There is no guarding or rebound.     Hernia: No hernia is present.  Genitourinary:    Comments: No cva tenderness. Dark brown stool sent for hemoccult testing.  Musculoskeletal:        General: No swelling.  Skin:    General: Skin is warm and dry.     Findings: No rash.  Neurological:     Mental Status: He is alert.     Comments: Alert, speech clear.   Psychiatric:     Comments: Mildly shaky in appearance.       ED Treatments / Results  Labs (all labs ordered  are listed, but only abnormal results are displayed) Results for orders placed or performed during the hospital encounter of 01/23/19  SARS Coronavirus 2 (CEPHEID - Performed in [facility]), [facility]   Specimen: Nasopharyngeal Swab  Result Value Ref Range   SARS Coronavirus 2 NEGATIVE NEGATIVE  Comprehensive metabolic panel  Result Value Ref Range   Sodium 136 135 - 145 mmol/L   Potassium 2.8 (L) 3.5 - 5.1 mmol/L   Chloride 80 (L) 98 - 111 mmol/L  CO2 31 22 - 32 mmol/L   Glucose, Bld 141 (H) 70 - 99 mg/dL   BUN 31 (H) 6 - 20 mg/dL   Creatinine, Ser 4.091.39 (H) 0.61 - 1.24 mg/dL   Calcium 8.8 (L) 8.9 - 10.3 mg/dL   Total Protein 8.4 (H) 6.5 - 8.1 g/dL   Albumin 4.6 3.5 - 5.0 g/dL   AST 22 15 - 41 U/L   ALT 19 0 - 44 U/L   Alkaline Phosphatase 90 38 - 126 U/L   Total Bilirubin 2.7 (H) 0.3 - 1.2 mg/dL   GFR calc non Af Amer 57 (L) >60 mL/min   GFR calc Af Amer >60 >60 mL/min   Anion gap 25 (H) 5 - 15  CBC with Differential  Result Value Ref Range   WBC 16.1 (H) 4.0 - 10.5 K/uL   RBC 5.70 4.22 - 5.81 MIL/uL   Hemoglobin 16.1 13.0 - 17.0 g/dL   HCT 81.147.7 91.439.0 - 78.252.0 %   MCV 83.7 80.0 - 100.0 fL   MCH 28.2 26.0 - 34.0 pg   MCHC 33.8 30.0 - 36.0 g/dL   RDW 95.615.6 (H) 21.311.5 - 08.615.5 %   Platelets 297 150 - 400 K/uL   nRBC 0.0 0.0 - 0.2 %   Neutrophils Relative % 84 %   Neutro Abs 13.5 (H) 1.7 - 7.7 K/uL   Lymphocytes Relative 9 %   Lymphs Abs 1.4 0.7 - 4.0 K/uL   Monocytes Relative 7 %   Monocytes Absolute 1.1 (H) 0.1 - 1.0 K/uL   Eosinophils Relative 0 %   Eosinophils Absolute 0.0 0.0 - 0.5 K/uL   Basophils Relative 0 %   Basophils Absolute 0.0 0.0 - 0.1 K/uL   Immature Granulocytes 0 %   Abs Immature Granulocytes 0.06 0.00 - 0.07 K/uL  APTT  Result Value Ref Range   aPTT 26 24 - 36 seconds  Ethanol  Result Value Ref Range   Alcohol, Ethyl (B) 131 (H) <10 mg/dL  Protime-INR  Result Value Ref Range   Prothrombin Time 12.5 11.4 - 15.2 seconds   INR 0.9 0.8 -  1.2  Magnesium  Result Value Ref Range   Magnesium 2.6 (H) 1.7 - 2.4 mg/dL  Hemoglobin and hematocrit, blood  Result Value Ref Range   Hemoglobin 14.2 13.0 - 17.0 g/dL   HCT 57.843.2 46.939.0 - 62.952.0 %  Hemoglobin and hematocrit, blood  Result Value Ref Range   Hemoglobin 13.6 13.0 - 17.0 g/dL   HCT 52.841.1 41.339.0 - 24.452.0 %  CBC  Result Value Ref Range   WBC 10.9 (H) 4.0 - 10.5 K/uL   RBC 4.56 4.22 - 5.81 MIL/uL   Hemoglobin 13.0 13.0 - 17.0 g/dL   HCT 01.039.7 27.239.0 - 53.652.0 %   MCV 87.1 80.0 - 100.0 fL   MCH 28.5 26.0 - 34.0 pg   MCHC 32.7 30.0 - 36.0 g/dL   RDW 64.415.7 (H) 03.411.5 - 74.215.5 %   Platelets 188 150 - 400 K/uL   nRBC 0.0 0.0 - 0.2 %  POC occult blood, ED  Result Value Ref Range   Fecal Occult Bld NEGATIVE NEGATIVE  Type and screen [facility]  Result Value Ref Range   ABO/RH(D) O POS    Antibody Screen NEG    Sample Expiration      01/26/2019,2359 Performed at [facility], [address] [place], [place] [address]     EKG EKG Interpretation  Date/Time:  Saturday January 23 2019 11:52:29 EDT Ventricular Rate:  109 PR Interval:    QRS Duration: 100 QT Interval:  322 QTC Calculation: 434 R Axis:   8 Text Interpretation:  Sinus tachycardia Nonspecific T wave abnormality Baseline wander Confirmed by Lajean Saver [phone]) on 01/24/2019 7:15:37 AM   Radiology No results found.  Procedures Procedures (including critical care time)  Medications Ordered in ED Medications  sodium chloride 0.9 % bolus 1,000 mL (has no administration in time range)  ondansetron (ZOFRAN) injection 4 mg (has no administration in time range)  pantoprazole (PROTONIX) 80 mg in sodium chloride 0.9 % 100 mL IVPB (has no administration in time range)  pantoprazole (PROTONIX) 80 mg in sodium chloride 0.9 % 250 mL (0.32 mg/mL) infusion (has no administration in time range)  LORazepam (ATIVAN) injection 1 mg (has no administration in time range)     Initial Impression /  Assessment and Plan / ED Course  I have reviewed the triage vital signs and the nursing notes.  Pertinent labs & imaging results that were available during my care of the patient were reviewed by me and considered in my medical decision making (see chart for details).  Iv ns bolus. protonix iv. Ativan iv. Labs sent stat.   2nd large bore iv. Type and screen.  protonix iv bolus and gtt.  Reviewed nursing notes and prior charts for additional history.   Feel tachycardia likely in part due to emesis/vomiting blood, and in part due to heavy etoh use/abuse/etoh withdrawal.   With ivf, ativan, hr improved. Sinus.   Recheck, abd soft nt. Nausea improved from prior.   Labs reviewed by me - hgb normal. k is low. Ecg. kcl iv x 3 runs. Mg added to labs.  Medicine team consulted for admission.  CRITICAL CARE RE: hemoptysis on anticoag therapy, etoh abuse hx esophageal varices, tachycardia, etoh abuse and withdrawal.  Performed by: Mirna Mires Total critical care time: 40 minutes Critical care time was exclusive of separately billable procedures and treating other patients. Critical care was necessary to treat or prevent imminent or life-threatening deterioration. Critical care was time spent personally by me on the following activities: development of treatment plan with patient and/or surrogate as well as nursing, discussions with consultants, evaluation of patient's response to treatment, examination of patient, obtaining history from patient or surrogate, ordering and performing treatments and interventions, ordering and review of laboratory studies, ordering and review of radiographic studies, pulse oximetry and re-evaluation of patient's condition.   Final Clinical Impressions(s) / ED Diagnoses   Final diagnoses:  None    ED Discharge Orders    None       Lajean Saver, MD 01/24/19 [phone]

## 2019-01-23 NOTE — ED Triage Notes (Signed)
Per EMS: Pt went to [facility] thinking it was the ED with c/o of blood in his vomit.  Pt hx of esophageal varices.  Pt c/o of pain in throat.  Pt had emesis x1 with blood.  Pt has had vomiting the past week.  Pt hx alcoholism.

## 2019-01-23 NOTE — Plan of Care (Signed)
Case reviewed with Dr. Raelyn Mora of [facility].  Has coffee grounds in emesis basin with minimal overt blood.  Is in apixaban.  Similar presentation in [facility] in February with EGD showing severe esophagitis without varices.  Hgb normal; Platelets normal.  Advised for today:  -PPI gtt -Volume repletion -Hold apixaban -Watch for alcohol withdrawal. -Doubt variceal bleeding:  Hold off on EGD for another day or two unless overt hemorrhage more worrisome for variceal bleeding ensues.

## 2019-01-23 NOTE — ED Notes (Signed)
Pt's last drink of alcohol was at 9 am today

## 2019-01-23 NOTE — H&P (Addendum)
History and Physical    Randall Simpson GNF:[id number] DOB: 04/13/1966 DOA: 01/23/2019  PCP: Storm FriskWright, Patrick E, MD  Patient coming from: home   I have personally briefly reviewed patient's old medical records available.   Chief Complaint: vomiting dark blood, I need detox.   HPI: Randall Simpson Simpson is a 53 y.o. male with medical history significant of alcoholism with previous alcohol withdrawal problems, severe esophagitis with upper GI bleeding status post EGD in 08/2018 at [facility], DVT and subsegmental PE diagnosed 08/2018, stayed sober for last 2 months, started drinking about a week ago, heavy drinking 6-10 beers a day presenting today to the hospital with multiple dark emesis with streaks of blood.  According to the patient, he started feeling nauseated since yesterday and he thought he should go to rehab so he went to our behavior center to be checked in for rehab, however when he was in the reception, he started vomiting and it was dark red blood with multiple episodes, he felt weak and shaky so they [facility].  Patient denies any similar events since last endoscopy.  He is not taking any Protonix as prescribed.  He is however religiously taking his blood thinners.  Patient denies any abdominal pain.  He has some dull retrosternal pain with vomiting.  Denies any bowel changes.  He has no fever or chills.  No chest pain.  No respiratory symptoms. ED Course: Afebrile, tachycardic, blood pressures are stable.  WBC 16,000.  Potassium 2.8.  Slightly elevated BUN/creatinine.  FOBT negative.  Active dark vomitus in the vomitus bag. Patient was given 1 L bolus isotonic fluid, hemoglobin is 16.1.  INR is 0.9.  Upper GI endoscopy done at [facility] on 08/03/2018 reviewed that shows severe grade D esophagitis, no varices.  No ulcers.  Review of Systems: all systems are reviewed and pertinent positive as per HPI otherwise rest are negative.    Past Medical  History:  Diagnosis Date  . Alcohol withdrawal (HCC) 08/14/2018  . Alcohol withdrawal syndrome, with delirium (HCC) 08/03/2018  . Atrial fibrillation (HCC)   . Carotid artery disease (HCC)   . DVT (deep venous thrombosis) (HCC)   . ETOH abuse   . MI (myocardial infarction) (HCC) 06/02/2017   [place]    Past Surgical History:  Procedure Laterality Date  . SHOULDER SURGERY       reports that he has been smoking cigarettes. He has been smoking about 0.50 packs per day. He has never used smokeless tobacco. He reports current alcohol use. He reports that he does not use drugs.  Allergies  Allergen Reactions  . Xarelto [Rivaroxaban] Other (See Comments)    Difficulty swallowing    Family History  Problem Relation Age of Onset  . Cancer Mother   . Cancer Father      Prior to Admission medications   Medication Sig Start Date End Date Taking? Authorizing Provider  acetaminophen (TYLENOL) 325 MG tablet Take 975-1,300 mg by mouth every 6 (six) hours as needed for mild pain, moderate pain or headache.   Yes [provider]  albuterol (PROVENTIL HFA;VENTOLIN HFA) 108 (90 Base) MCG/ACT inhaler Inhale 2 puffs into the lungs every 6 (six) hours as needed for wheezing or shortness of breath.   Yes [provider]  apixaban (ELIQUIS) 5 MG TABS tablet Take 1 tablet (5 mg total) by mouth 2 (two) times daily. 12/14/18  Yes Storm FriskWright, Patrick E, MD  aspirin 81  MG chewable tablet Chew by mouth daily.   Yes [provider]  atorvastatin (LIPITOR) 20 MG tablet Take 1 tablet (20 mg total) by mouth daily. 12/07/18  Yes [name], MD  COENZYME Q10 PO Take 1 capsule by mouth daily.   Yes [provider]  Multiple Vitamin (MULTIVITAMIN WITH MINERALS) TABS tablet Take 1 tablet by mouth daily.   Yes [provider]  traZODone (DESYREL) 100 MG tablet Take 1 tablet (100 mg total) by mouth at bedtime as needed for sleep. 12/14/18  Yes [name], MD     Physical Exam: Vitals:   01/23/19 1216 01/23/19 1218  BP: (!) 139/93   Pulse: (!) 113   Resp: 13   Temp: 98.2 F (36.8 C)   TempSrc: Oral   SpO2: 95%   Weight:  74.8 kg  Height:  5\' 10"  (1.778 m)    Constitutional: NAD, calm, comfortable Vitals:   01/23/19 1216 01/23/19 1218  BP: (!) 139/93   Pulse: (!) 113   Resp: 13   Temp: 98.2 F (36.8 C)   TempSrc: Oral   SpO2: 95%   Weight:  74.8 kg  Height:  5\' 10"  (1.778 m)   Eyes: PERRL, lids and conjunctivae normal, on room air.  Fairly comfortable.  Sick looking. ENMT: Mucous membranes are dry . Posterior pharynx clear of any exudate or lesions.Normal dentition.  Neck: normal, supple, no masses, no thyromegaly Respiratory: clear to auscultation bilaterally, no wheezing, no crackles. Normal respiratory effort. No accessory muscle use.  Cardiovascular: Regular rate and rhythm, tachycardic, no murmurs / rubs / gallops. No extremity edema. 2+ pedal pulses. No carotid bruits.  Abdomen: no tenderness, no masses palpated. No hepatosplenomegaly. Bowel sounds positive.  Musculoskeletal: no clubbing / cyanosis. No joint deformity upper and lower extremities. Good ROM, no contractures. Normal muscle tone.  Skin: no rashes, lesions, ulcers. No induration Neurologic: CN 2-12 grossly intact. Sensation intact, DTR normal. Strength 5/5 in all 4.  Psychiatric: Normal judgment and insight. Alert and oriented x 3.  Anxious.  Vomit bag at the bedside one quarter full with dark vomitus.    Labs on Admission: I have personally reviewed following labs and imaging studies  CBC: Recent Labs  Lab 01/23/19 1214  WBC 16.1*  NEUTROABS 13.5*  HGB 16.1  HCT 47.7  MCV 83.7  PLT 297   Basic Metabolic Panel: Recent Labs  Lab 01/23/19 1214 01/23/19 1323  NA 136  --   K 2.8*  --   CL 80*  --   CO2 31  --   GLUCOSE 141*  --   BUN 31*  --   CREATININE 1.39*  --   CALCIUM 8.8*  --   MG  --  2.6*   GFR: Estimated Creatinine Clearance: 62  mL/min (A) (by C-G formula based on SCr of 1.39 mg/dL (H)). Liver Function Tests: Recent Labs  Lab 01/23/19 1214  AST 22  ALT 19  ALKPHOS 90  BILITOT 2.7*  PROT 8.4*  ALBUMIN 4.6   No results for input(s): LIPASE, AMYLASE in the last 168 hours. No results for input(s): AMMONIA in the last 168 hours. Coagulation Profile: Recent Labs  Lab 01/23/19 1211  INR 0.9   Cardiac Enzymes: No results for input(s): CKTOTAL, CKMB, CKMBINDEX, TROPONINI in the last 168 hours. BNP (last 3 results) No results for input(s): PROBNP in the last 8760 hours. HbA1C: No results for input(s): HGBA1C in the last 72 hours. CBG: No results for input(s):  GLUCAP in the last 168 hours. Lipid Profile: No results for input(s): CHOL, HDL, LDLCALC, TRIG, CHOLHDL, LDLDIRECT in the last 72 hours. Thyroid Function Tests: No results for input(s): TSH, T4TOTAL, FREET4, T3FREE, THYROIDAB in the last 72 hours. Anemia Panel: No results for input(s): VITAMINB12, FOLATE, FERRITIN, TIBC, IRON, RETICCTPCT in the last 72 hours. Urine analysis: No results found for: COLORURINE, APPEARANCEUR, LABSPEC, PHURINE, GLUCOSEU, HGBUR, BILIRUBINUR, KETONESUR, PROTEINUR, UROBILINOGEN, NITRITE, LEUKOCYTESUR  Radiological Exams on Admission: No results found.  EKG: Independently reviewed.  Sinus tachycardia.  No acute ST-T wave changes.  Assessment/Plan Principal Problem:   Acute upper GI bleeding Active Problems:   DVT (deep venous thrombosis) (HCC)   Alcohol abuse   Tobacco abuse   Single subsegmental pulmonary embolism without acute cor pulmonale   Hypokalemia     1.  Acute upper GI bleeding: With history of grade D esophagitis, currently on anticoagulation. Probable source of bleeding is esophageal erosion.  No history of varices. Hemoglobin is currently stable, however this is hemoconcentrated. Patient received 1 L of normal saline, will transfuse 1 more liter and keep on maintenance IV fluids. H&H every 6 hours  until stabilization. Protonix bolus and infusion. N.p.o. with ice chips. Case discussed with gastroenterology, suggested aggressive medical management.  GI will follow.  2.  History of DVT and PE: Ongoing alcoholism makes patient a poor candidate for therapeutic anticoagulation.  DVT and PE was more than 4 months old, holding aspirin and Eliquis.  3.  Alcoholism with risk of alcohol withdrawal: Patient is high risk.  Will keep on fall precautions, CIWA protocol with benzodiazepine and multivitamins.  He is interested in rehab.  Will likely finish his detox while getting treated for upper GI bleeding.  4.  Hypokalemia: Replace aggressively and monitor levels.  5.  Smoker: Counseled.  Less likely to quit.  Will give nicotine patch.  Case personally discussed with GI service, Dr. Paulita Fujita.  DVT prophylaxis: SCDs Code Status: Full code Family Communication: None Disposition Plan: To be decided Consults called: Gastroenterology, Dr. Paulita Fujita Admission status: as observation tonight    Barb Merino MD [facility] Pager [phone]  If 7PM-7AM, please contact night-coverage [email or web address] Password [email or web address]  01/23/2019, 2:16 PM

## 2019-01-24 DIAGNOSIS — Z720 Tobacco use: Secondary | ICD-10-CM

## 2019-01-24 DIAGNOSIS — E876 Hypokalemia: Secondary | ICD-10-CM

## 2019-01-24 DIAGNOSIS — I2693 Single subsegmental pulmonary embolism without acute cor pulmonale: Secondary | ICD-10-CM

## 2019-01-24 DIAGNOSIS — I82452 Acute embolism and thrombosis of left peroneal vein: Secondary | ICD-10-CM

## 2019-01-24 DIAGNOSIS — K922 Gastrointestinal hemorrhage, unspecified: Secondary | ICD-10-CM | POA: Diagnosis present

## 2019-01-24 DIAGNOSIS — F1010 Alcohol abuse, uncomplicated: Secondary | ICD-10-CM

## 2019-01-24 LAB — HEMOGLOBIN AND HEMATOCRIT, BLOOD
HCT: 41.1 % (ref 39.0–52.0)
HCT: 41 % (ref 39.0–52.0)
HCT: 38.3 % — ABNORMAL LOW (ref 39.0–52.0)
Hemoglobin: 13.6 g/dL (ref 13.0–17.0)
Hemoglobin: 13.5 g/dL (ref 13.0–17.0)
Hemoglobin: 12.7 g/dL — ABNORMAL LOW (ref 13.0–17.0)

## 2019-01-24 LAB — BASIC METABOLIC PANEL
Anion gap: 9 (ref 5–15)
BUN: 18 mg/dL (ref 6–20)
CO2: 28 mmol/L (ref 22–32)
Calcium: 8.3 mg/dL — ABNORMAL LOW (ref 8.9–10.3)
Chloride: 98 mmol/L (ref 98–111)
Creatinine, Ser: 0.76 mg/dL (ref 0.61–1.24)
GFR calc Af Amer: >60 mL/min (ref >60)
GFR calc non Af Amer: >60 mL/min (ref >60)
Glucose, Bld: 123 mg/dL — ABNORMAL HIGH (ref 70–99)
Potassium: 3.8 mmol/L (ref 3.5–5.1)
Sodium: 135 mmol/L (ref 135–145)

## 2019-01-24 LAB — MAGNESIUM: Magnesium: 2.7 mg/dL — ABNORMAL HIGH (ref 1.7–2.4)

## 2019-01-24 LAB — PROTIME-INR
INR: 0.9 (ref 0.8–1.2)
Prothrombin Time: 12.4 seconds (ref 11.4–15.2)

## 2019-01-24 LAB — CBC
HCT: 39.7 % (ref 39.0–52.0)
Hemoglobin: 13 g/dL (ref 13.0–17.0)
MCH: 28.5 pg (ref 26.0–34.0)
MCHC: 32.7 g/dL (ref 30.0–36.0)
MCV: 87.1 fL (ref 80.0–100.0)
Platelets: 188 10*3/uL (ref 150–400)
RBC: 4.56 MIL/uL (ref 4.22–5.81)
RDW: 15.7 % — ABNORMAL HIGH (ref 11.5–15.5)
WBC: 10.9 10*3/uL — ABNORMAL HIGH (ref 4.0–10.5)
nRBC: 0 % (ref 0.0–0.2)

## 2019-01-24 LAB — PHOSPHORUS: Phosphorus: 1.7 mg/dL — ABNORMAL LOW (ref 2.5–4.6)

## 2019-01-24 NOTE — Consult Note (Signed)
[facility] Consultation Note  Referring Provider: Dr. K. Ghimire ([facility]) Primary Care Physician:  Wright, Patrick E, MD  Reason for Consultation:  Coffee ground emesis  HPI: Randall Simpson is a 53 y.o. male whom I've been asked to see for evaluation of coffee ground emesis.  Patient has history of alcohol abuse (but without known history of cirrhosis) and has history of couple prior GI bleeds, typically coffee ground emesis.  He has had a couple prior similar episodes, most recently in February where he had endoscopy showing severe esophagitis.  Patient reports longstanding history of GERD, takes medication for this sporadically.  More recently, over the past one to two weeks, he's had some troubles with chest discomfort, odynophagia, and coffee ground emesis with some frank intermittent hematemesis.  No NSAIDs.  Does take apixaban for atrial fibrillation.  Some epigastric discomfort and intermittently dark stools as well.  No weight loss.    Past Medical History:  Diagnosis Date  . Alcohol withdrawal (HCC) 08/14/2018  . Alcohol withdrawal syndrome, with delirium (HCC) 08/03/2018  . Atrial fibrillation (HCC)   . Carotid artery disease (HCC)   . DVT (deep venous thrombosis) (HCC)   . ETOH abuse   . MI (myocardial infarction) (HCC) 06/02/2017   [place]    Past Surgical History:  Procedure Laterality Date  . SHOULDER SURGERY      Prior to Admission medications   Medication Sig Start Date End Date Taking? Authorizing Provider  acetaminophen (TYLENOL) 325 MG tablet Take 975-1,300 mg by mouth every 6 (six) hours as needed for mild pain, moderate pain or headache.   Yes [provider]  albuterol (PROVENTIL HFA;VENTOLIN HFA) 108 (90 Base) MCG/ACT inhaler Inhale 2 puffs into the lungs every 6 (six) hours as needed for wheezing or shortness of breath.   Yes [provider]  apixaban (ELIQUIS) 5 MG TABS tablet Take 1 tablet (5 mg total) by mouth 2 (two) times daily.  12/14/18  Yes Wright, Patrick E, MD  aspirin 81 MG chewable tablet Chew by mouth daily.   Yes [provider]  atorvastatin (LIPITOR) 20 MG tablet Take 1 tablet (20 mg total) by mouth daily. 12/07/18  Yes Wright, Patrick E, MD  COENZYME Q10 PO Take 1 capsule by mouth daily.   Yes [provider]  Multiple Vitamin (MULTIVITAMIN WITH MINERALS) TABS tablet Take 1 tablet by mouth daily.   Yes [provider]  traZODone (DESYREL) 100 MG tablet Take 1 tablet (100 mg total) by mouth at bedtime as needed for sleep. 12/14/18  Yes Wright, Patrick E, MD    Current Facility-Administered Medications  Medication Dose Route Frequency Provider Last Rate Last Dose  . acetaminophen (TYLENOL) tablet 650 mg  650 mg Oral Q6H PRN Ghimire, Kuber, MD       Or  . acetaminophen (TYLENOL) suppository 650 mg  650 mg Rectal Q6H PRN Ghimire, Kuber, MD      . albuterol (PROVENTIL) (2.5 MG/3ML) 0.083% nebulizer solution 2.5 mg  2.5 mg Inhalation Q6H PRN Ghimire, Kuber, MD      . dextrose 5 %-0.45 % sodium chloride infusion   Intravenous Continuous Ghimire, Kuber, MD 100 mL/hr at 01/24/19 1032    . folic acid (FOLVITE) tablet 1 mg  1 mg Oral Daily Ghimire, Kuber, MD   1 mg at 01/24/19 0840  . LORazepam (ATIVAN) injection 0-4 mg  0-4 mg Intravenous Q6H Ghimire, Kuber, MD       Followed by  . [START ON 01/25/2019]   LORazepam (ATIVAN) injection 0-4 mg  0-4 mg Intravenous Q12H Dorcas CarrowGhimire, Kuber, MD      . LORazepam (ATIVAN) tablet 1 mg  1 mg Oral Q6H PRN Dorcas CarrowGhimire, Kuber, MD   1 mg at 01/24/19 0441   Or  . LORazepam (ATIVAN) injection 1 mg  1 mg Intravenous Q6H PRN Dorcas CarrowGhimire, Kuber, MD      . multivitamin with minerals tablet 1 tablet  1 tablet Oral Daily Dorcas CarrowGhimire, Kuber, MD   1 tablet at 01/24/19 0840  . ondansetron (ZOFRAN) tablet 4 mg  4 mg Oral Q6H PRN Dorcas CarrowGhimire, Kuber, MD       Or  . ondansetron (ZOFRAN) injection 4 mg  4 mg Intravenous Q6H PRN Dorcas CarrowGhimire, Kuber, MD   4 mg at 01/24/19 0855  . pantoprazole  (PROTONIX) 80 mg in sodium chloride 0.9 % 250 mL (0.32 mg/mL) infusion  8 mg/hr Intravenous Continuous Dorcas CarrowGhimire, Kuber, MD 25 mL/hr at 01/24/19 1030 8 mg/hr at 01/24/19 1030  . potassium chloride SA (K-DUR) CR tablet 40 mEq  40 mEq Oral BID Dorcas CarrowGhimire, Kuber, MD   40 mEq at 01/24/19 0839  . thiamine (VITAMIN B-1) tablet 100 mg  100 mg Oral Daily Dorcas CarrowGhimire, Kuber, MD   100 mg at 01/24/19 0840   Or  . thiamine (B-1) injection 100 mg  100 mg Intravenous Daily Dorcas CarrowGhimire, Kuber, MD      . traZODone (DESYREL) tablet 100 mg  100 mg Oral QHS PRN Dorcas CarrowGhimire, Kuber, MD        Allergies as of 01/23/2019 - Review Complete 01/23/2019  Allergen Reaction Noted  . Xarelto [rivaroxaban] Other (See Comments) 09/02/2018    Family History  Problem Relation Age of Onset  . Cancer Mother   . Cancer Father     Social History   Socioeconomic History  . Marital status: Single    Spouse name: Not on file  . Number of children: Not on file  . Years of education: Not on file  . Highest education level: Not on file  Occupational History  . Not on file  Social Needs  . Financial resource strain: Not on file  . Food insecurity    Worry: Not on file    Inability: Not on file  . Transportation needs    Medical: Not on file    Non-medical: Not on file  Tobacco Use  . Smoking status: Current Every Day Smoker    Packs/day: 0.50    Types: Cigarettes  . Smokeless tobacco: Never Used  Substance and Sexual Activity  . Alcohol use: Yes    Comment: daily use-8 pack of wine and several pints of beer and vodka   . Drug use: Never  . Sexual activity: Not Currently  Lifestyle  . Physical activity    Days per week: Not on file    Minutes per session: Not on file  . Stress: Not on file  Relationships  . Social Musicianconnections    Talks on phone: Not on file    Gets together: Not on file    Attends religious service: Not on file    Active member of club or organization: Not on file    Attends meetings of clubs or  organizations: Not on file    Relationship status: Not on file  . Intimate partner violence    Fear of current or ex partner: Not on file    Emotionally abused: Not on file    Physically abused: Not on file    Forced sexual  activity: Not on file  Other Topics Concern  . Not on file  Social History Narrative  . Not on file    Review of Systems: As per HPI, all others negative  Physical Exam: Vital signs in last 24 hours: Temp:  [97.8 F (36.6 C)-98.4 F (36.9 C)] 98.3 F (36.8 C) (07/26 1219) Pulse Rate:  [72-120] 72 (07/26 1219) Resp:  [13-23] 20 (07/26 1219) BP: (122-143)/(73-88) 122/74 (07/26 1219) SpO2:  [93 %-97 %] 97 % (07/26 1219) Weight:  [81.2 kg] 81.2 kg (07/25 1640)   General:   Alert,  Well-developed, well-nourished, pleasant and cooperative in NAD Head:  Normocephalic and atraumatic. Eyes:  Sclera clear, no icterus.   Conjunctiva pink. Ears:  Normal auditory acuity. Nose:  No deformity, discharge,  or lesions. Mouth:  No deformity or lesions.  Oropharynx pink & moist. Neck:  Supple; no masses or thyromegaly. Lungs:  Clear throughout to auscultation.   No wheezes, crackles, or rhonchi. No acute distress. Heart:  Regular rate and rhythm; no murmurs, clicks, rubs,  or gallops. Abdomen:  Soft, mild epigastric tenderness without peritonitis, nondistended. No masses, hepatosplenomegaly or hernias noted. Normal bowel sounds, without guarding, and without rebound.     Msk:  Symmetrical without gross deformities. Normal posture. Pulses:  Normal pulses noted. Extremities:  Without clubbing or edema. Neurologic:  Alert and  oriented x4;  grossly normal neurologically. Skin:  Multiple tattooes, otherwise Intact without significant lesions or rashes. Cervical Nodes:  No significant cervical adenopathy. Psych:  Alert and cooperative. Normal mood and affect.   Lab Results: Recent Labs    01/23/19 1214  01/24/19 0034 01/24/19 0552 01/24/19 1147  WBC 16.1*  --   --   10.9*  --   HGB 16.1   < > 13.6 13.0 13.5  HCT 47.7   < > 41.1 39.7 41.0  PLT 297  --   --  188  --    < > = values in this interval not displayed.   BMET Recent Labs    01/23/19 1214 01/24/19 0552  NA 136 135  K 2.8* 3.8  CL 80* 98  CO2 31 28  GLUCOSE 141* 123*  BUN 31* 18  CREATININE 1.39* 0.76  CALCIUM 8.8* 8.3*   LFT Recent Labs    01/23/19 1214  PROT 8.4*  ALBUMIN 4.6  AST 22  ALT 19  ALKPHOS 90  BILITOT 2.7*   PT/INR Recent Labs    01/23/19 1211 01/24/19 0552  LABPROT 12.5 12.4  INR 0.9 0.9    Studies/Results: No results found.  Impression:  1.  Coffee ground emesis.  Prior episodes x 2, last 6 months ago.  Suspect recurrent esophagitis.  Doubt variceal bleed.  No clear evidence that patient has cirrhosis. 2.  Acute blood loss anemia, mild. 3.  GERD, severe, on intermittent antisecretory therapy. 4.  Atrial fibrillation on apixaban. 5.  Alcohol abuse.  Plan:  1.  Soft diet ok as tolerated. 2.  Apixaban on hold.  Would continue PPI. 3.  Alcohol withdrawal protocol. 4.  Follow CBC.  Plan for endoscopy tomorrow. 5.  Risks (bleeding, infection, bowel perforation that could require surgery, sedation-related changes in cardiopulmonary systems), benefits (identification and possible treatment of source of symptoms, exclusion of certain causes of symptoms), and alternatives (watchful waiting, radiographic imaging studies, empiric medical treatment) of upper endoscopy (EGD) were explained to patient/family in detail and patient wishes to proceed. 6.  Next step in management pending EGD findings.  If recurrent  esophagitis seen, then patient will need indefinite PPI therapy. 7.  [facility] will follow.   LOS: 0 days   Latrell Potempa M  01/24/2019, 1:13 PM  Cell [phone] If no answer or after 5 PM call [phone]

## 2019-01-24 NOTE — Progress Notes (Signed)
PROGRESS NOTE    Randall Simpson  AVW:[id number] DOB: 09/26/1965 DOA: 01/23/2019 PCP: [name], MD   Brief Narrative:  HPI per admitting MD:  Randall Simpson is a 53 y.o. male with medical history significant of alcoholism with previous alcohol withdrawal problems, severe esophagitis with upper GI bleeding status post EGD in 08/2018 at [facility], DVT and subsegmental PE diagnosed 08/2018, stayed sober for last 2 months, started drinking about a week ago, heavy drinking 6-10 beers a day presenting today to the hospital with multiple dark emesis with streaks of blood.  According to the patient, he started feeling nauseated since yesterday and he thought he should go to rehab so he went to our behavior center to be checked in for rehab, however when he was in the reception, he started vomiting and it was dark red blood with multiple episodes, he felt weak and shaky so they sent him to the [facility] long ER.  Patient denies any similar events since last endoscopy.  He is not taking any Protonix as prescribed.  He is however religiously taking his blood thinners.  Patient denies any abdominal pain.  He has some dull retrosternal pain with vomiting.  Denies any bowel changes.  He has no fever or chills.  No chest pain.  No respiratory symptoms. ED Course: Afebrile, tachycardic, blood pressures are stable.  WBC 16,000.  Potassium 2.8.  Slightly elevated BUN/creatinine.  FOBT negative.  Active dark vomitus in the vomitus bag. Patient was given 1 L bolus isotonic fluid, hemoglobin is 16.1.  INR is 0.9.   Assessment & Plan:   Principal Problem:   Acute upper GI bleeding Active Problems:   DVT (deep venous thrombosis) (HCC)   Alcohol abuse   Tobacco abuse   Single subsegmental pulmonary embolism without acute cor pulmonale   Hypokalemia   Acute upper GI bleeding: AS PREVIOUSLY NOTED, history of grade D esophagitis, currently on anticoagulation. Probable source of bleeding is esophageal  erosion.  No history of varices. Mild decline in hemoglobin although patient was hemoconcentrated on admission Patient received 1 L of normal saline, will transfuse 1 more liter and keep on maintenance IV fluids. H&H every 6 hours until stabilization. Protonix bolus and infusion. N.p.o. with ice chips. Case discussed with gastroenterology, suggested aggressive medical management.  GI following  2.  History of DVT and PE: Ongoing alcoholism makes patient a poor candidate for therapeutic anticoagulation.  DVT and PE was more than 4 months old, holding aspirin and Eliquis for now given patient's high risk of bleeding  3.  Alcoholism with risk of alcohol withdrawal: Patient is high risk.  Will keep on fall precautions, CIWA protocol with benzodiazepine and multivitamins.  He is interested in rehab.  Will likely finish his detox while getting treated for upper GI bleeding.  4.  Hypokalemia: Replace aggressively and monitor levels.  5.  Smoker: Counseled.  Less likely to quit.  Will give nicotine patch.  6.  Acute blood loss anemia.  Secondary to #1.  Follow hemoglobin as above, , ppi GTT  DVT prophylaxis: SCD/Compression stockings  Code Status: Full    Code Status Orders  (From admission, onward)         Start     Ordered   01/23/19 1642  Full code  Continuous     01/23/19 1641        Code Status History    Date Active Date Inactive Code Status Order ID Comments User Context   01/04/2019  1726 01/05/2019 1756 Full Code [id number]  Denzil Magnusonhomas, Lashunda, NP Inpatient   01/04/2019 1530 01/04/2019 1720 Full Code [id number]  Bennye AlmFawze, Mina A, PA-C ED   11/16/2018 2138 11/17/2018 1835 Full Code [id number]  Linwood DibblesKnapp, Jon, MD ED   08/12/2018 2349 08/15/2018 1808 Full Code [id number]  Therisa Doyneoutova, Anastassia, MD Inpatient   08/12/2018 2022 08/12/2018 2349 Full Code [id number]  Alvira MondaySchlossman, Erin, MD ED   Advance Care Planning Activity     Family Communication: none today Disposition Plan:   Patient will be changed to  inpatient given upper GI bleeding with acute blood loss anemia requiring serial hemoglobins, continue Protonix drip, and subspecialty evaluation. Consults called: None Admission status: Inpatient   Consultants:   GI  Procedures:   No results found.  Antimicrobials:   NONE    Subjective: Patient admitted overnight with upper GI bleed in the setting of alcohol with history of alcoholic gastritis Still reports vomiting with some blood.  Objective: Vitals:   01/23/19 1640 01/23/19 2027 01/23/19 2355 01/24/19 0543  BP: 136/85 124/73 125/75 126/75  Pulse: (!) 110 (!) 104 91 84  Resp: 18 20 19 18   Temp: 98.4 F (36.9 C) 98.4 F (36.9 C) 98.1 F (36.7 C) 97.8 F (36.6 C)  TempSrc: Oral Oral Oral Oral  SpO2: 97% 95% 94% 97%  Weight: 81.2 kg     Height: 5\' 10"  (1.778 m)       Intake/Output Summary (Last 24 hours) at 01/24/2019 1053 Last data filed at 01/24/2019 0800 Gross per 24 hour  Intake 2807.13 ml  Output 1600 ml  Net 1207.13 ml   Filed Weights   01/23/19 1218 01/23/19 1640  Weight: 74.8 kg 81.2 kg    Examination:  General exam: Appears calm and comfortable  Respiratory system: Clear to auscultation. Respiratory effort normal. Cardiovascular system: S1 & S2 heard, RRR. No JVD, murmurs, rubs, gallops or clicks. No pedal edema. Gastrointestinal system: Abdomen is nondistended, soft, mildly tender. No organomegaly or masses felt. Normal bowel sounds heard. Central nervous system: Alert and oriented. No focal neurological deficits. Extremities: Symmetric 5 x 5 strength, warm well perfused Skin: No rashes, lesions or ulcers Psychiatry: Judgement and insight appear normal. Mood & affect appropriate.     Data Reviewed: I have personally reviewed following labs and imaging studies  CBC: Recent Labs  Lab 01/23/19 1214 01/23/19 1902 01/24/19 0034 01/24/19 0552  WBC 16.1*  --   --  10.9*  NEUTROABS 13.5*  --   --   --   HGB 16.1 14.2 13.6 13.0  HCT 47.7 43.2  41.1 39.7  MCV 83.7  --   --  87.1  PLT 297  --   --  188   Basic Metabolic Panel: Recent Labs  Lab 01/23/19 1214 01/23/19 1323 01/24/19 0552  NA 136  --  135  K 2.8*  --  3.8  CL 80*  --  98  CO2 31  --  28  GLUCOSE 141*  --  123*  BUN 31*  --  18  CREATININE 1.39*  --  0.76  CALCIUM 8.8*  --  8.3*  MG  --  2.6* 2.7*  PHOS  --   --  1.7*   GFR: Estimated Creatinine Clearance: 107.7 mL/min (by C-G formula based on SCr of 0.76 mg/dL). Liver Function Tests: Recent Labs  Lab 01/23/19 1214  AST 22  ALT 19  ALKPHOS 90  BILITOT 2.7*  PROT 8.4*  ALBUMIN 4.6   No results for input(s):  LIPASE, AMYLASE in the last 168 hours. No results for input(s): AMMONIA in the last 168 hours. Coagulation Profile: Recent Labs  Lab 01/23/19 1211 01/24/19 0552  INR 0.9 0.9   Cardiac Enzymes: No results for input(s): CKTOTAL, CKMB, CKMBINDEX, TROPONINI in the last 168 hours. BNP (last 3 results) No results for input(s): PROBNP in the last 8760 hours. HbA1C: No results for input(s): HGBA1C in the last 72 hours. CBG: No results for input(s): GLUCAP in the last 168 hours. Lipid Profile: No results for input(s): CHOL, HDL, LDLCALC, TRIG, CHOLHDL, LDLDIRECT in the last 72 hours. Thyroid Function Tests: No results for input(s): TSH, T4TOTAL, FREET4, T3FREE, THYROIDAB in the last 72 hours. Anemia Panel: No results for input(s): VITAMINB12, FOLATE, FERRITIN, TIBC, IRON, RETICCTPCT in the last 72 hours. Sepsis Labs: No results for input(s): PROCALCITON, LATICACIDVEN in the last 168 hours.  Recent Results (from the past 240 hour(s))  SARS Coronavirus 2 (CEPHEID - [facility]), Hosp Order     Status: None   Collection Time: 01/23/19  2:25 PM   Specimen: Nasopharyngeal Swab  Result Value Ref Range Status   SARS Coronavirus 2 NEGATIVE NEGATIVE Final    Comment: (NOTE) If result is NEGATIVE SARS-CoV-2 target nucleic acids are NOT DETECTED. The SARS-CoV-2 RNA is  generally detectable in upper and lower  respiratory specimens during the acute phase of infection. The lowest  concentration of SARS-CoV-2 viral copies this assay can detect is 250  copies / mL. A negative result does not preclude SARS-CoV-2 infection  and should not be used as the sole basis for treatment or other  patient management decisions.  A negative result may occur with  improper specimen collection / handling, submission of specimen other  than nasopharyngeal swab, presence of viral mutation(s) within the  areas targeted by this assay, and inadequate number of viral copies  (<250 copies / mL). A negative result must be combined with clinical  observations, patient history, and epidemiological information. If result is POSITIVE SARS-CoV-2 target nucleic acids are DETECTED. The SARS-CoV-2 RNA is generally detectable in upper and lower  respiratory specimens dur ing the acute phase of infection.  Positive  results are indicative of active infection with SARS-CoV-2.  Clinical  correlation with patient history and other diagnostic information is  necessary to determine patient infection status.  Positive results do  not rule out bacterial infection or co-infection with other viruses. If result is PRESUMPTIVE POSTIVE SARS-CoV-2 nucleic acids MAY BE PRESENT.   A presumptive positive result was obtained on the submitted specimen  and confirmed on repeat testing.  While 2019 novel coronavirus  (SARS-CoV-2) nucleic acids may be present in the submitted sample  additional confirmatory testing may be necessary for epidemiological  and / or clinical management purposes  to differentiate between  SARS-CoV-2 and other Sarbecovirus currently known to infect humans.  If clinically indicated additional testing with an alternate test  methodology [id number]) is advised. The SARS-CoV-2 RNA is generally  detectable in upper and lower respiratory sp ecimens during the acute  phase of infection.  The expected result is Negative. Fact Sheet for Patients:  BoilerBrush.com.cyhttps://www.fda.gov/media/136312/download Fact Sheet for Healthcare Providers: https://pope.com/https://www.fda.gov/media/136313/download This test is not yet approved or cleared by the [place] FDA and has been authorized for detection and/or diagnosis of SARS-CoV-2 by FDA under an Emergency Use Authorization (EUA).  This EUA will remain in effect (meaning this test can be used) for the duration of the COVID-19 declaration under Section 564(b)(1)  of the Act, 21 U.S.C. section 360bbb-3(b)(1), unless the authorization is terminated or revoked sooner. Performed at [facility], [place] [address] [place], [place] [address]          Radiology Studies: No results found.      Scheduled Meds: . folic acid  1 mg Oral Daily  . LORazepam  0-4 mg Intravenous Q6H   Followed by  . [START ON 01/25/2019] LORazepam  0-4 mg Intravenous Q12H  . multivitamin with minerals  1 tablet Oral Daily  . potassium chloride  40 mEq Oral BID  . thiamine  100 mg Oral Daily   Or  . thiamine  100 mg Intravenous Daily   Continuous Infusions: . dextrose 5 % and 0.45% NaCl 100 mL/hr at 01/24/19 1032  . pantoprozole (PROTONIX) infusion 8 mg/hr (01/24/19 1030)     LOS: 0 days    Time spent: Real    Nicolette Bang, MD [facility]  If 7PM-7AM, please contact night-coverage  01/24/2019, 10:53 AM

## 2019-01-24 NOTE — H&P (View-Only) (Signed)
[facility] Consultation Note  Referring Provider: Dr. Corrie MckusickK. Ghimire [facility] Primary Care Physician:  Storm FriskWright, Patrick E, MD  Reason for Consultation:  Coffee ground emesis  HPI: Randall Simpson is a 53 y.o. male whom I've been asked to see for evaluation of coffee ground emesis.  Patient has history of alcohol abuse (but without known history of cirrhosis) and has history of couple prior GI bleeds, typically coffee ground emesis.  He has had a couple prior similar episodes, most recently in February where he had endoscopy showing severe esophagitis.  Patient reports longstanding history of GERD, takes medication for this sporadically.  More recently, over the past one to two weeks, he's had some troubles with chest discomfort, odynophagia, and coffee ground emesis with some frank intermittent hematemesis.  No NSAIDs.  Does take apixaban for atrial fibrillation.  Some epigastric discomfort and intermittently dark stools as well.  No weight loss.    Past Medical History:  Diagnosis Date  . Alcohol withdrawal (HCC) 08/14/2018  . Alcohol withdrawal syndrome, with delirium (HCC) 08/03/2018  . Atrial fibrillation (HCC)   . Carotid artery disease (HCC)   . DVT (deep venous thrombosis) (HCC)   . ETOH abuse   . MI (myocardial infarction) (HCC) 06/02/2017   [place]    Past Surgical History:  Procedure Laterality Date  . SHOULDER SURGERY      Prior to Admission medications   Medication Sig Start Date End Date Taking? Authorizing Provider  acetaminophen (TYLENOL) 325 MG tablet Take 975-1,300 mg by mouth every 6 (six) hours as needed for mild pain, moderate pain or headache.   Yes [provider]  albuterol (PROVENTIL HFA;VENTOLIN HFA) 108 (90 Base) MCG/ACT inhaler Inhale 2 puffs into the lungs every 6 (six) hours as needed for wheezing or shortness of breath.   Yes [provider]  apixaban (ELIQUIS) 5 MG TABS tablet Take 1 tablet (5 mg total) by mouth 2 (two) times daily.  12/14/18  Yes Storm FriskWright, Patrick E, MD  aspirin 81 MG chewable tablet Chew by mouth daily.   Yes [provider]  atorvastatin (LIPITOR) 20 MG tablet Take 1 tablet (20 mg total) by mouth daily. 12/07/18  Yes Storm FriskWright, Patrick E, MD  COENZYME Q10 PO Take 1 capsule by mouth daily.   Yes [provider]  Multiple Vitamin (MULTIVITAMIN WITH MINERALS) TABS tablet Take 1 tablet by mouth daily.   Yes [provider]  traZODone (DESYREL) 100 MG tablet Take 1 tablet (100 mg total) by mouth at bedtime as needed for sleep. 12/14/18  Yes Storm FriskWright, Patrick E, MD    Current Facility-Administered Medications  Medication Dose Route Frequency Provider Last Rate Last Dose  . acetaminophen (TYLENOL) tablet 650 mg  650 mg Oral Q6H PRN Dorcas CarrowGhimire, Kuber, MD       Or  . acetaminophen (TYLENOL) suppository 650 mg  650 mg Rectal Q6H PRN Dorcas CarrowGhimire, Kuber, MD      . albuterol (PROVENTIL) (2.5 MG/3ML) 0.083% nebulizer solution 2.5 mg  2.5 mg Inhalation Q6H PRN Dorcas CarrowGhimire, Kuber, MD      . dextrose 5 %-0.45 % sodium chloride infusion   Intravenous Continuous Dorcas CarrowGhimire, Kuber, MD 100 mL/hr at 01/24/19 1032    . folic acid (FOLVITE) tablet 1 mg  1 mg Oral Daily Dorcas CarrowGhimire, Kuber, MD   1 mg at 01/24/19 0840  . LORazepam (ATIVAN) injection 0-4 mg  0-4 mg Intravenous Q6H Dorcas CarrowGhimire, Kuber, MD       Followed by  . [START ON 01/25/2019]  LORazepam (ATIVAN) injection 0-4 mg  0-4 mg Intravenous Q12H Dorcas CarrowGhimire, Kuber, MD      . LORazepam (ATIVAN) tablet 1 mg  1 mg Oral Q6H PRN Dorcas CarrowGhimire, Kuber, MD   1 mg at 01/24/19 0441   Or  . LORazepam (ATIVAN) injection 1 mg  1 mg Intravenous Q6H PRN Dorcas CarrowGhimire, Kuber, MD      . multivitamin with minerals tablet 1 tablet  1 tablet Oral Daily Dorcas CarrowGhimire, Kuber, MD   1 tablet at 01/24/19 0840  . ondansetron (ZOFRAN) tablet 4 mg  4 mg Oral Q6H PRN Dorcas CarrowGhimire, Kuber, MD       Or  . ondansetron (ZOFRAN) injection 4 mg  4 mg Intravenous Q6H PRN Dorcas CarrowGhimire, Kuber, MD   4 mg at 01/24/19 0855  . pantoprazole  (PROTONIX) 80 mg in sodium chloride 0.9 % 250 mL (0.32 mg/mL) infusion  8 mg/hr Intravenous Continuous Dorcas CarrowGhimire, Kuber, MD 25 mL/hr at 01/24/19 1030 8 mg/hr at 01/24/19 1030  . potassium chloride SA (K-DUR) CR tablet 40 mEq  40 mEq Oral BID Dorcas CarrowGhimire, Kuber, MD   40 mEq at 01/24/19 0839  . thiamine (VITAMIN B-1) tablet 100 mg  100 mg Oral Daily Dorcas CarrowGhimire, Kuber, MD   100 mg at 01/24/19 0840   Or  . thiamine (B-1) injection 100 mg  100 mg Intravenous Daily Dorcas CarrowGhimire, Kuber, MD      . traZODone (DESYREL) tablet 100 mg  100 mg Oral QHS PRN Dorcas CarrowGhimire, Kuber, MD        Allergies as of 01/23/2019 - Review Complete 01/23/2019  Allergen Reaction Noted  . Xarelto [rivaroxaban] Other (See Comments) 09/02/2018    Family History  Problem Relation Age of Onset  . Cancer Mother   . Cancer Father     Social History   Socioeconomic History  . Marital status: Single    Spouse name: Not on file  . Number of children: Not on file  . Years of education: Not on file  . Highest education level: Not on file  Occupational History  . Not on file  Social Needs  . Financial resource strain: Not on file  . Food insecurity    Worry: Not on file    Inability: Not on file  . Transportation needs    Medical: Not on file    Non-medical: Not on file  Tobacco Use  . Smoking status: Current Every Day Smoker    Packs/day: 0.50    Types: Cigarettes  . Smokeless tobacco: Never Used  Substance and Sexual Activity  . Alcohol use: Yes    Comment: daily use-8 pack of wine and several pints of beer and vodka   . Drug use: Never  . Sexual activity: Not Currently  Lifestyle  . Physical activity    Days per week: Not on file    Minutes per session: Not on file  . Stress: Not on file  Relationships  . Social Musicianconnections    Talks on phone: Not on file    Gets together: Not on file    Attends religious service: Not on file    Active member of club or organization: Not on file    Attends meetings of clubs or  organizations: Not on file    Relationship status: Not on file  . Intimate partner violence    Fear of current or ex partner: Not on file    Emotionally abused: Not on file    Physically abused: Not on file    Forced sexual  activity: Not on file  Other Topics Concern  . Not on file  Social History Narrative  . Not on file    Review of Systems: As per HPI, all others negative  Physical Exam: Vital signs in last 24 hours: Temp:  [97.8 F (36.6 C)-98.4 F (36.9 C)] 98.3 F (36.8 C) (07/26 1219) Pulse Rate:  [72-120] 72 (07/26 1219) Resp:  [13-23] 20 (07/26 1219) BP: (122-143)/(73-88) 122/74 (07/26 1219) SpO2:  [93 %-97 %] 97 % (07/26 1219) Weight:  [81.2 kg] 81.2 kg (07/25 1640)   General:   Alert,  Well-developed, well-nourished, pleasant and cooperative in NAD Head:  Normocephalic and atraumatic. Eyes:  Sclera clear, no icterus.   Conjunctiva pink. Ears:  Normal auditory acuity. Nose:  No deformity, discharge,  or lesions. Mouth:  No deformity or lesions.  Oropharynx pink & moist. Neck:  Supple; no masses or thyromegaly. Lungs:  Clear throughout to auscultation.   No wheezes, crackles, or rhonchi. No acute distress. Heart:  Regular rate and rhythm; no murmurs, clicks, rubs,  or gallops. Abdomen:  Soft, mild epigastric tenderness without peritonitis, nondistended. No masses, hepatosplenomegaly or hernias noted. Normal bowel sounds, without guarding, and without rebound.     Msk:  Symmetrical without gross deformities. Normal posture. Pulses:  Normal pulses noted. Extremities:  Without clubbing or edema. Neurologic:  Alert and  oriented x4;  grossly normal neurologically. Skin:  Multiple tattooes, otherwise Intact without significant lesions or rashes. Cervical Nodes:  No significant cervical adenopathy. Psych:  Alert and cooperative. Normal mood and affect.   Lab Results: Recent Labs    01/23/19 1214  01/24/19 0034 01/24/19 0552 01/24/19 1147  WBC 16.1*  --   --   10.9*  --   HGB 16.1   < > 13.6 13.0 13.5  HCT 47.7   < > 41.1 39.7 41.0  PLT 297  --   --  188  --    < > = values in this interval not displayed.   BMET Recent Labs    01/23/19 1214 01/24/19 0552  NA 136 135  K 2.8* 3.8  CL 80* 98  CO2 31 28  GLUCOSE 141* 123*  BUN 31* 18  CREATININE 1.39* 0.76  CALCIUM 8.8* 8.3*   LFT Recent Labs    01/23/19 1214  PROT 8.4*  ALBUMIN 4.6  AST 22  ALT 19  ALKPHOS 90  BILITOT 2.7*   PT/INR Recent Labs    01/23/19 1211 01/24/19 0552  LABPROT 12.5 12.4  INR 0.9 0.9    Studies/Results: No results found.  Impression:  1.  Coffee ground emesis.  Prior episodes x 2, last 6 months ago.  Suspect recurrent esophagitis.  Doubt variceal bleed.  No clear evidence that patient has cirrhosis. 2.  Acute blood loss anemia, mild. 3.  GERD, severe, on intermittent antisecretory therapy. 4.  Atrial fibrillation on apixaban. 5.  Alcohol abuse.  Plan:  1.  Soft diet ok as tolerated. 2.  Apixaban on hold.  Would continue PPI. 3.  Alcohol withdrawal protocol. 4.  Follow CBC.  Plan for endoscopy tomorrow. 5.  Risks (bleeding, infection, bowel perforation that could require surgery, sedation-related changes in cardiopulmonary systems), benefits (identification and possible treatment of source of symptoms, exclusion of certain causes of symptoms), and alternatives (watchful waiting, radiographic imaging studies, empiric medical treatment) of upper endoscopy (EGD) were explained to patient/family in detail and patient wishes to proceed. 6.  Next step in management pending EGD findings.  If recurrent  esophagitis seen, then patient will need indefinite PPI therapy. 7.  [facility] will follow.   LOS: 0 days   Seniya Stoffers M  01/24/2019, 1:13 PM  Cell [phone] If no answer or after 5 PM call [phone]

## 2019-01-25 ENCOUNTER — Encounter (HOSPITAL_COMMUNITY)
Admission: EM | Disposition: A | Discharge status: Home / Self Care | Payer: Self-pay | Source: Home / Self Care | Attending: Internal Medicine

## 2019-01-25 ENCOUNTER — Encounter (HOSPITAL_COMMUNITY): Payer: Self-pay | Admitting: *Deleted

## 2019-01-25 ENCOUNTER — Inpatient Hospital Stay (HOSPITAL_COMMUNITY): Payer: Self-pay | Admitting: Anesthesiology

## 2019-01-25 HISTORY — PX: ESOPHAGOGASTRODUODENOSCOPY (EGD) WITH PROPOFOL: SHX5813

## 2019-01-25 LAB — HEMOGLOBIN AND HEMATOCRIT, BLOOD
HCT: 39.8 % (ref 39.0–52.0)
HCT: 40.4 % (ref 39.0–52.0)
Hemoglobin: 12.7 g/dL — ABNORMAL LOW (ref 13.0–17.0)
Hemoglobin: 12.7 g/dL — ABNORMAL LOW (ref 13.0–17.0)

## 2019-01-25 SURGERY — ESOPHAGOGASTRODUODENOSCOPY (EGD) WITH PROPOFOL
Anesthesia: Monitor Anesthesia Care | Laterality: Left

## 2019-01-25 MED ORDER — PANTOPRAZOLE SODIUM 40 MG IV SOLR
40.0000 mg | Freq: Two times a day (BID) | INTRAVENOUS | Status: DC
  Administered 2019-01-25 – 2019-01-26 (×3): 40 mg via INTRAVENOUS
  Filled 2019-01-25 (×3): qty 40

## 2019-01-25 MED ORDER — PROPOFOL 500 MG/50ML IV EMUL
INTRAVENOUS | Status: DC | PRN
  Administered 2019-01-25: 100 ug/kg/min via INTRAVENOUS

## 2019-01-25 MED ORDER — SODIUM CHLORIDE 0.9 % IV SOLN: INTRAVENOUS | Status: DC

## 2019-01-25 MED ORDER — PROPOFOL 10 MG/ML IV BOLUS
INTRAVENOUS | Status: DC | PRN
  Administered 2019-01-25: 20 mg via INTRAVENOUS
  Administered 2019-01-25: 40 mg via INTRAVENOUS
  Administered 2019-01-25 (×3): 20 mg via INTRAVENOUS
  Administered 2019-01-25: 40 mg via INTRAVENOUS
  Administered 2019-01-25: 20 mg via INTRAVENOUS

## 2019-01-25 SURGICAL SUPPLY — 15 items

## 2019-01-25 NOTE — Brief Op Note (Signed)
Severe distal ulcerative esophagitis with evidence of recent bleeding without active bleeding. Change to IV PPI Q 12 hours. Clear liquid diet and do not advance today but consider advancing slowly tomorrow. Hold Eliquis. Will follow. Strongly advised to stop drinking alcohol but not convinced that he will. He says he just wants to eat dinner.

## 2019-01-25 NOTE — Progress Notes (Signed)
PROGRESS NOTE    Randall Simpson  NOM:[id number] DOB: 09-24-1965 DOA: 01/23/2019 PCP: [name], MD   Brief Narrative:  HPI per admitting MD: Randall Simpson a 53 y.o.malewith medical history significant ofalcoholism with previous alcohol withdrawal problems, severe esophagitis with upper GI bleeding status post EGD in 08/2018 at [facility], DVT and subsegmental PE diagnosed 08/2018, stayed sober for last 2 months, started drinking about a week ago, heavy drinking 6-10 beers a day presenting today to the hospital with multiple dark emesis with streaks of blood. According to the patient, he started feeling nauseated since yesterday and he thought he should go to rehab [facility], however when he was in the reception, he started vomiting and it was dark red blood with multiple episodes, he felt weak and shaky so they sent him to the [facility] long ER. Patient denies any similar events since last endoscopy. He is not taking any Protonix as prescribed. He is however religiously taking his blood thinners. Patient denies any abdominal pain. He has some dull retrosternal pain with vomiting. Denies any bowel changes. He has no fever or chills. No chest pain. No respiratory symptoms   Assessment & Plan:   Principal Problem:   Acute upper GI bleeding Active Problems:   DVT (deep venous thrombosis) (HCC)   Alcohol abuse   Tobacco abuse   Single subsegmental pulmonary embolism without acute cor pulmonale   Hypokalemia   GI (gastrointestinal bleed)   Acute upper GI bleeding: AS PREVIOUSLY NOTED, history of grade D esophagitis, on anticoagulation PTA Probable source of bleeding is esophageal erosion. No history of varices. Mild decline in hemoglobin although patient was hemoconcentrated on admission Patient received 1 L of normal saline, will transfuse 1 more liter and keep on maintenance IV fluids. Case discussed with  gastroenterology, EGD today, change ppi to iv bid, clear liquids  2.History of DVT and PE: Ongoing alcoholism makes patient a poor candidate for therapeutic anticoagulation. DVT and PE was more than 4 months old, holding aspirin and Eliquis for now given patient's high risk of bleeding  3.Alcoholism with risk of alcohol withdrawal: Patient is high risk. Will keep on fall precautions, CIWA protocol with benzodiazepine and multivitamins. He is interested in rehab. Will likely finish his detox while getting treated for upper GI bleeding.  4.Hypokalemia: Replace aggressively and monitor levels.  5.Smoker: Counseled. Less likely to quit. Will give nicotine patch.  6.  Acute blood loss anemia.  Secondary to #1.  Follow hemoglobin as above, , ppi GTT  DVT prophylaxis: SCD/Compression stockings  Code Status: Full    Code Status Orders  (From admission, onward)         Start     Ordered   01/23/19 1642  Full code  Continuous     01/23/19 1641        Code Status History    Date Active Date Inactive Code Status Order ID Comments User Context   01/04/2019 1726 01/05/2019 1756 Full Code [id number]  Denzil Magnuson, NP Inpatient   01/04/2019 1530 01/04/2019 1720 Full Code [id number]  Bennye Alm ED   11/16/2018 2138 11/17/2018 1835 Full Code [id number]  Linwood Dibbles, MD ED   08/12/2018 2349 08/15/2018 1808 Full Code [id number]  Therisa Doyne, MD Inpatient   08/12/2018 2022 08/12/2018 2349 Full Code [id number]  Alvira Monday, MD ED   Advance Care Planning Activity     Family Communication: none today  Disposition Plan:   Patient will remain inpatient additional day.  Monitor hemoglobin, advance diet as tolerated monitor for bleeding.  If stable anticipate discharge home tomorrow.  Without these treatments patient risk of severe life-threatening clinical deterioration/bleeding Consults called: None Admission status: Inpatient   Consultants:   gi  Procedures:   No  results found.  Antimicrobials:   None currently    Subjective: Reported feeling hungry this morning otherwise denied any additional bleeding   Objective: Vitals:   01/25/19 1326 01/25/19 1330 01/25/19 1335 01/25/19 1340  BP:  127/84  125/79  Pulse: 61 (!) 56 67 (!) 57  Resp: (!) 21 19 12 14   Temp:      TempSrc:      SpO2: 99% 99% 100% 100%  Weight:      Height:        Intake/Output Summary (Last 24 hours) at 01/25/2019 1349 Last data filed at 01/25/2019 1325 Gross per 24 hour  Intake 290 ml  Output 2375 ml  Net -2085 ml   Filed Weights   01/23/19 1218 01/23/19 1640  Weight: 74.8 kg 81.2 kg    Examination:  General exam: Appears calm and comfortable  Respiratory system: Clear to auscultation. Respiratory effort normal. Cardiovascular system: S1 & S2 heard, RRR. No JVD, murmurs, rubs, gallops or clicks. No pedal edema. Gastrointestinal system: Abdomen is nondistended, soft and nontender. No organomegaly or masses felt. Normal bowel sounds heard. Central nervous system: Alert and oriented. No focal neurological deficits. Extremities: Symmetric 5 x 5 strength, WWP Skin: No rashes, lesions or ulcers Psychiatry: Judgement and insight appear normal. Mood & affect appropriate.     Data Reviewed: I have personally reviewed following labs and imaging studies  CBC: Recent Labs  Lab 01/23/19 1214  01/24/19 0552 01/24/19 1147 01/24/19 1858 01/25/19 0032 01/25/19 0543  WBC 16.1*  --  10.9*  --   --   --   --   NEUTROABS 13.5*  --   --   --   --   --   --   HGB 16.1   < > 13.0 13.5 12.7* 12.7* 12.7*  HCT 47.7   < > 39.7 41.0 38.3* 39.8 40.4  MCV 83.7  --  87.1  --   --   --   --   PLT 297  --  188  --   --   --   --    < > = values in this interval not displayed.   Basic Metabolic Panel: Recent Labs  Lab 01/23/19 1214 01/23/19 1323 01/24/19 0552  NA 136  --  135  K 2.8*  --  3.8  CL 80*  --  98  CO2 31  --  28  GLUCOSE 141*  --  123*  BUN 31*  --  18   CREATININE 1.39*  --  0.76  CALCIUM 8.8*  --  8.3*  MG  --  2.6* 2.7*  PHOS  --   --  1.7*   GFR: Estimated Creatinine Clearance: 107.7 mL/min (by C-G formula based on SCr of 0.76 mg/dL). Liver Function Tests: Recent Labs  Lab 01/23/19 1214  AST 22  ALT 19  ALKPHOS 90  BILITOT 2.7*  PROT 8.4*  ALBUMIN 4.6   No results for input(s): LIPASE, AMYLASE in the last 168 hours. No results for input(s): AMMONIA in the last 168 hours. Coagulation Profile: Recent Labs  Lab 01/23/19 1211 01/24/19 0552  INR 0.9 0.9   Cardiac Enzymes: No  results for input(s): CKTOTAL, CKMB, CKMBINDEX, TROPONINI in the last 168 hours. BNP (last 3 results) No results for input(s): PROBNP in the last 8760 hours. HbA1C: No results for input(s): HGBA1C in the last 72 hours. CBG: No results for input(s): GLUCAP in the last 168 hours. Lipid Profile: No results for input(s): CHOL, HDL, LDLCALC, TRIG, CHOLHDL, LDLDIRECT in the last 72 hours. Thyroid Function Tests: No results for input(s): TSH, T4TOTAL, FREET4, T3FREE, THYROIDAB in the last 72 hours. Anemia Panel: No results for input(s): VITAMINB12, FOLATE, FERRITIN, TIBC, IRON, RETICCTPCT in the last 72 hours. Sepsis Labs: No results for input(s): PROCALCITON, LATICACIDVEN in the last 168 hours.  Recent Results (from the past 240 hour(s))  SARS Coronavirus 2 (CEPHEID - Performed in [facility] hospital lab), Hosp Order     Status: None   Collection Time: 01/23/19  2:25 PM   Specimen: Nasopharyngeal Swab  Result Value Ref Range Status   SARS Coronavirus 2 NEGATIVE NEGATIVE Final    Comment: (NOTE) If result is NEGATIVE SARS-CoV-2 target nucleic acids are NOT DETECTED. The SARS-CoV-2 RNA is generally detectable in upper and lower  respiratory specimens during the acute phase of infection. The lowest  concentration of SARS-CoV-2 viral copies this assay can detect is 250  copies / mL. A negative result does not preclude SARS-CoV-2 infection  and  should not be used as the sole basis for treatment or other  patient management decisions.  A negative result may occur with  improper specimen collection / handling, submission of specimen other  than nasopharyngeal swab, presence of viral mutation(s) within the  areas targeted by this assay, and inadequate number of viral copies  (<250 copies / mL). A negative result must be combined with clinical  observations, patient history, and epidemiological information. If result is POSITIVE SARS-CoV-2 target nucleic acids are DETECTED. The SARS-CoV-2 RNA is generally detectable in upper and lower  respiratory specimens dur ing the acute phase of infection.  Positive  results are indicative of active infection with SARS-CoV-2.  Clinical  correlation with patient history and other diagnostic information is  necessary to determine patient infection status.  Positive results do  not rule out bacterial infection or co-infection with other viruses. If result is PRESUMPTIVE POSTIVE SARS-CoV-2 nucleic acids MAY BE PRESENT.   A presumptive positive result was obtained on the submitted specimen  and confirmed on repeat testing.  While 2019 novel coronavirus  (SARS-CoV-2) nucleic acids may be present in the submitted sample  additional confirmatory testing may be necessary for epidemiological  and / or clinical management purposes  to differentiate between  SARS-CoV-2 and other Sarbecovirus currently known to infect humans.  If clinically indicated additional testing with an alternate test  methodology [phone] is advised. The SARS-CoV-2 RNA is generally  detectable in upper and lower respiratory sp ecimens during the acute  phase of infection. The expected result is Negative. Fact Sheet for Patients:  BoilerBrush.com.cyhttps://www.fda.gov/media/136312/download Fact Sheet for Healthcare Providers: https://pope.com/https://www.fda.gov/media/136313/download This test is not yet approved or cleared by the [place] FDA and has been  authorized for detection and/or diagnosis of SARS-CoV-2 by FDA under an Emergency Use Authorization (EUA).  This EUA will remain in effect (meaning this test can be used) for the duration of the COVID-19 declaration under Section 564(b)(1) of the Act, 21 U.S.C. section 360bbb-3(b)(1), unless the authorization is terminated or revoked sooner. Performed at [facility], [address] [place], [place] [address]  Radiology Studies: No results found.      Scheduled Meds: . folic acid  1 mg Oral Daily  . LORazepam  0-4 mg Intravenous Q6H   Followed by  . LORazepam  0-4 mg Intravenous Q12H  . multivitamin with minerals  1 tablet Oral Daily  . pantoprazole (PROTONIX) IV  40 mg Intravenous Q12H  . potassium chloride  40 mEq Oral BID  . thiamine  100 mg Oral Daily   Or  . thiamine  100 mg Intravenous Daily   Continuous Infusions: . dextrose 5 % and 0.45% NaCl 100 mL/hr at 01/25/19 0659     LOS: 1 day    Time spent: [place], MD [facility]  If 7PM-7AM, please contact night-coverage  01/25/2019, 1:49 PM

## 2019-01-25 NOTE — Transfer of Care (Signed)
Immediate Anesthesia Transfer of Care Note  Patient: Randall Simpson  Procedure(s) Performed: Procedure(s): ESOPHAGOGASTRODUODENOSCOPY (EGD) WITH PROPOFOL (Left)  Patient Location: PACU  Anesthesia Type:MAC  Level of Consciousness:  sedated, patient cooperative and responds to stimulation  Airway & Oxygen Therapy:Patient Spontanous Breathing and Patient connected to face mask oxgen  Post-op Assessment:  Report given to PACU RN and Post -op Vital signs reviewed and stable  Post vital signs:  Reviewed and stable  Last Vitals:  Vitals:   01/25/19 1322 01/25/19 1325  BP: 125/66 125/66  Pulse: 67 75  Resp: 17 20  Temp:    SpO2: 250% 037%    Complications: No apparent anesthesia complications

## 2019-01-25 NOTE — Op Note (Signed)
[facility] Patient Name: Randall Simpson Procedure Date: 01/25/2019 MRN: [id number] Attending MD: Shirley FriarVincent C Khya Halls , MD Date of Birth: 01/02/1966 CSN: [id number] Age: [age] Admit Type: Inpatient Procedure:                Upper GI endoscopy Indications:              Suspected upper gastrointestinal bleeding,                            Coffee-ground emesis Providers:                Shirley FriarVincent C. Chella Chapdelaine, MD, Janae SauceKaren D. Steele BergHinson, RN, Arlee Muslimhris                            Chandler Tech., Technician, Paris LoreShannon M. Blanton                            CRNA, CRNA Referring MD:             hospital team Medicines:                Propofol per Anesthesia, Monitored Anesthesia Care Complications:            No immediate complications. Estimated Blood Loss:     Estimated blood loss was minimal. Procedure:                Pre-Anesthesia Assessment:                           - Prior to the procedure, a History and Physical                            was performed, and patient medications and                            allergies were reviewed. The patient's tolerance of                            previous anesthesia was also reviewed. The risks                            and benefits of the procedure and the sedation                            options and risks were discussed with the patient.                            All questions were answered, and informed consent                            was obtained. Prior Anticoagulants: The patient has                            taken Eliquis (apixaban), last dose was 7 days  prior to procedure. ASA Grade Assessment: III - A                            patient with severe systemic disease. After                            reviewing the risks and benefits, the patient was                            deemed in satisfactory condition to undergo the                            procedure.                           After obtaining informed  consent, the endoscope was                            passed under direct vision. Throughout the                            procedure, the patient's blood pressure, pulse, and                            oxygen saturations were monitored continuously. The                            [id number]-H190 ([id number]) Olympus gastroscope was                            introduced through the mouth, and advanced to the                            second part of duodenum. The upper GI endoscopy was                            accomplished without difficulty. The patient                            tolerated the procedure well. Scope In: Scope Out: Findings:      LA Grade D (one or more mucosal breaks involving at least 75% of       esophageal circumference) esophagitis with bleeding was found 30 to 38       cm from the incisors.      A medium-sized hiatal hernia was present.      Segmental mild inflammation characterized by congestion (edema) and       erythema was found in the gastric antrum.      The exam of the stomach was otherwise normal.      Patchy mild mucosal changes characterized by congestion and erythema       were found in the duodenal bulb.      The exam of the duodenum was otherwise normal. Impression:               - LA Grade D reflux esophagitis.                           -  Medium-sized hiatal hernia.                           - Acute gastritis.                           - Mucosal changes in the duodenum.                           - No specimens collected. Moderate Sedation:      Not Applicable - Patient had care per Anesthesia. Recommendation:           - Clear liquid diet.                           - Observe patient's clinical course. Procedure Code(s):        --- Professional ---                           [phone], Esophagogastroduodenoscopy, flexible,                            transoral; diagnostic, including collection of                            specimen(s) by brushing or washing,  when performed                            (separate procedure) Diagnosis Code(s):        --- Professional ---                           K92.0, Hematemesis                           K29.00, Acute gastritis without bleeding                           K21.0, Gastro-esophageal reflux disease with                            esophagitis                           K31.89, Other diseases of stomach and duodenum                           K44.9, Diaphragmatic hernia without obstruction or                            gangrene CPT copyright 2019 American Medical Association. All rights reserved. The codes documented in this report are preliminary and upon coder review may  be revised to meet current compliance requirements. Shirley Friar, MD 01/25/2019 1:30:26 PM This report has been signed electronically. Number of Addenda: 0

## 2019-01-25 NOTE — Interval H&P Note (Signed)
History and Physical Interval Note:  01/25/2019 12:49 PM  Randall Simpson  has presented today for surgery, with the diagnosis of hematemesis.  The various methods of treatment have been discussed with the patient and family. After consideration of risks, benefits and other options for treatment, the patient has consented to  Procedure(s): ESOPHAGOGASTRODUODENOSCOPY (EGD) WITH PROPOFOL (Left) as a surgical intervention.  The patient's history has been reviewed, patient examined, no change in status, stable for surgery.  I have reviewed the patient's chart and labs.  Questions were answered to the patient's satisfaction.     [name]

## 2019-01-25 NOTE — Anesthesia Preprocedure Evaluation (Signed)
Anesthesia Evaluation  Patient identified by MRN, date of birth, ID band Patient awake    Reviewed: Allergy & Precautions, NPO status , Patient's Chart, lab work & pertinent test results  Airway Mallampati: II  TM Distance: >3 FB Neck ROM: Full    Dental  (+) Teeth Intact, Dental Advisory Given   Pulmonary Current Smoker,    breath sounds clear to auscultation       Cardiovascular  Rhythm:Regular Rate:Normal     Neuro/Psych    GI/Hepatic   Endo/Other    Renal/GU      Musculoskeletal   Abdominal   Peds  Hematology   Anesthesia Other Findings   Reproductive/Obstetrics                             Anesthesia Physical Anesthesia Plan  ASA: III  Anesthesia Plan: MAC   Post-op Pain Management:    Induction: Intravenous  PONV Risk Score and Plan: Ondansetron  Airway Management Planned: Nasal Cannula and Natural Airway  Additional Equipment:   Intra-op Plan:   Post-operative Plan:   Informed Consent: I have reviewed the patients History and Physical, chart, labs and discussed the procedure including the risks, benefits and alternatives for the proposed anesthesia with the patient or authorized representative who has indicated his/her understanding and acceptance.     Dental advisory given  Plan Discussed with: CRNA and Anesthesiologist  Anesthesia Plan Comments:         Anesthesia Quick Evaluation

## 2019-01-25 NOTE — Anesthesia Postprocedure Evaluation (Signed)
Anesthesia Post Note  Patient: Randall Simpson  Procedure(s) Performed: ESOPHAGOGASTRODUODENOSCOPY (EGD) WITH PROPOFOL (Left )     Patient location during evaluation: Endoscopy Anesthesia Type: MAC Level of consciousness: awake and alert Pain management: pain level controlled Vital Signs Assessment: post-procedure vital signs reviewed and stable Respiratory status: spontaneous breathing, nonlabored ventilation, respiratory function stable and patient connected to nasal cannula oxygen Cardiovascular status: stable and blood pressure returned to baseline Postop Assessment: no apparent nausea or vomiting Anesthetic complications: no    Last Vitals:  Vitals:   01/25/19 1325 01/25/19 1326  BP: 125/66   Pulse: 75 61  Resp: 20 (!) 21  Temp: (!) (P) 36 C   SpO2: 100% 99%    Last Pain:  Vitals:   01/25/19 1325  TempSrc: (P) Oral  PainSc:                  Kirsty Monjaraz COKER

## 2019-01-26 ENCOUNTER — Encounter (HOSPITAL_COMMUNITY): Payer: Self-pay | Admitting: Gastroenterology

## 2019-01-26 LAB — HEMOGLOBIN AND HEMATOCRIT, BLOOD
HCT: 39.9 % (ref 39.0–52.0)
Hemoglobin: 12.5 g/dL — ABNORMAL LOW (ref 13.0–17.0)

## 2019-01-26 MED ORDER — PANTOPRAZOLE SODIUM 40 MG IV SOLR: 40.0000 mg | Freq: Two times a day (BID) | INTRAVENOUS | Status: DC

## 2019-01-26 MED ORDER — PANTOPRAZOLE SODIUM 40 MG PO TBEC: 40.0000 mg | DELAYED_RELEASE_TABLET | Freq: Two times a day (BID) | ORAL | 3 refills | Status: AC

## 2019-01-26 NOTE — Progress Notes (Signed)
[facility] Gastroenterology Progress Note  Randall Simpson 53 y.o. [birth year]   Subjective: Complaining of sore throat. Tolerating clear liquids without N/V. Reports formed stool this morning.  Objective: Vital signs: Vitals:   01/25/19 2114 01/26/19 0437  BP: 110/70 94/71  Pulse:  (!) 55  Resp: 16 18  Temp: 98.2 F (36.8 C) 98.3 F (36.8 C)  SpO2: 95% 95%    Physical Exam: Gen: alert, no acute distress HEENT: anicteric sclera CV: RRR Chest: CTA B Abd: soft, nontender, nondistended, +BS Ext: no edema  Lab Results: Recent Labs    01/23/19 1214 01/23/19 1323 01/24/19 0552  NA 136  --  135  K 2.8*  --  3.8  CL 80*  --  98  CO2 31  --  28  GLUCOSE 141*  --  123*  BUN 31*  --  18  CREATININE 1.39*  --  0.76  CALCIUM 8.8*  --  8.3*  MG  --  2.6* 2.7*  PHOS  --   --  1.7*   Recent Labs    01/23/19 1214  AST 22  ALT 19  ALKPHOS 90  BILITOT 2.7*  PROT 8.4*  ALBUMIN 4.6   Recent Labs    01/23/19 1214  01/24/19 0552  01/25/19 0543 01/26/19 0818  WBC 16.1*  --  10.9*  --   --   --   NEUTROABS 13.5*  --   --   --   --   --   HGB 16.1   < > 13.0   < > 12.7* 12.5*  HCT 47.7   < > 39.7   < > 40.4 39.9  MCV 83.7  --  87.1  --   --   --   PLT 297  --  188  --   --   --    < > = values in this interval not displayed.      Assessment/Plan: Severe ulcerative esophagitis on IV PPI Q 12 hours. Change diet to full liquid and advance as tolerated to soft diet. Change to PO PPI BID (for 3 months and then QD) when on soft diet and needs to continue as an outpt. Ok to resume Eliquis today and defer to primary team to resume. Strongly advised need to stop drinking alcohol and he expressed interest in doing so. Ok to go home tomorrow if tolerates diet. Will sign off. F/U with GI prn. Call if questions.   Randall Simpson 01/26/2019, 9:57 AM  Questions please call [phone] ID: Randall Simpson, male   DOB: 05/14/1966, 53 y.o.   MRN: [id number]

## 2019-01-26 NOTE — Discharge Summary (Signed)
Physician Discharge Summary  Randall Simpson PYK:[id number] DOB: December 12, 1965 DOA: 01/23/2019  PCP: Randall Frisk, MD  Admit date: 01/23/2019 Discharge date: 01/26/2019  Admitted From: Inpatient Disposition: home  Recommendations for Outpatient Follow-up:  1. Follow up with PCP in 1-2 weeks   Home Health:No Equipment/Devices:no new equip  Discharge Condition:Stable CODE STATUS:Full code Diet recommendation: soft diet  Brief/Interim Summary: HPIper admitting MD: Randall Simpson a 53 y.o.malewith medical history significant ofalcoholism with previous alcohol withdrawal problems, severe esophagitis with upper GI bleeding status post EGD in 08/2018 at [facility], DVT and subsegmental PE diagnosed 08/2018, stayed sober for last 2 months, started drinking about a week ago, heavy drinking 6-10 beers a day presenting today to the hospital with multiple dark emesis with streaks of blood. According to the patient, he started feeling nauseated since yesterday and he thought he should go to rehab [facility], however when he was in the reception, he started vomiting and it was dark red blood with multiple episodes, he felt weak and shaky so they sent him to the [facility] long ER. Patient denies any similar events since last endoscopy. He is not taking any Protonix as prescribed. He is however religiously taking his blood thinners. Patient denies any abdominal pain. He has some dull retrosternal pain with vomiting. Denies any bowel changes. He has no fever or chills. No chest pain. No respiratory symptoms  Hospital course: Acute upper GI bleeding:AS PREVIOUSLY NOTED,history of grade D esophagitis, on anticoagulation PTA Probable source of bleeding is esophageal erosion. No history of varices. Mild decline in hemoglobin although patient was hemoconcentrated on admission Patient received 2 L of normal saline, and maintenance IV  fluids. Case discussed with gastroenterology, EGD with distal ulcerative esophagitis without active bleeding but evidence of recent bleeding on discharge patient will be on twice daily PPI, GI cleared initiation of anticoagulation given patient's history of DVT and PE, patient received strong recommendations for alcohol cessation  2.History of DVT and PE: Patient's anticoagulation initially held secondary to bleeding.  We will reinitiate it with close follow-up with his PCP for outpatient follow-up and possible discontinuation.  An extensive discussion with the patient on the importance of close follow-up for further evaluation.  Also had extensive discussion on importance of alcohol abstinence and risk of rebleeding.  3.Alcoholism with risk of alcohol withdrawal: Patient at high risk of withdrawal.  Was placed on fall precautions, CIWA protocol with benzodiazepines multivitamins.  Patient stable will be discharged home today.  He will follow-up outpatient with his provider for further outpatient treatments.  There is no indication for acute medical detox at this point.  4.Hypokalemia: Replaced aggressively and monitor levels.  5.Smoker: Counseled. Patient received tobacco cessation counseling of 10 minutes duration, did not appear interested in cessation at this point regardless encouraged cessation.. Patient was given  nicotine patch.  6.Acute blood loss anemia. Secondary to #1. Follow hemoglobin as above, , ppi GTT while in the hospital, hemoglobin is remained stable at 12.74 and 12.5 this morning.  No further evidence of bleeding.  Discharge Diagnoses:  Principal Problem:   Acute upper GI bleeding Active Problems:   DVT (deep venous thrombosis) (HCC)   Alcohol abuse   Tobacco abuse   Single subsegmental pulmonary embolism without acute cor pulmonale   Hypokalemia   GI (gastrointestinal bleed)    Discharge Instructions  Discharge Instructions    Call MD for:    Complete by: As directed  Any acute change in medical condition which includes bleeding   Call MD for:  difficulty breathing, headache or visual disturbances   Complete by: As directed    Call MD for:  hives   Complete by: As directed    Call MD for:  persistant dizziness or light-headedness   Complete by: As directed    Call MD for:  persistant nausea and vomiting   Complete by: As directed    Call MD for:  redness, tenderness, or signs of infection (pain, swelling, redness, odor or green/yellow discharge around incision site)   Complete by: As directed    Call MD for:  severe uncontrolled pain   Complete by: As directed    Call MD for:  temperature >100.4   Complete by: As directed    Diet - low sodium heart healthy   Complete by: As directed    Increase activity slowly   Complete by: As directed      Allergies as of 01/26/2019      Reactions   Xarelto [rivaroxaban] Other (See Comments)   Difficulty swallowing      Medication List    STOP taking these medications   acetaminophen 325 MG tablet Commonly known as: TYLENOL     TAKE these medications   albuterol 108 (90 Base) MCG/ACT inhaler Commonly known as: VENTOLIN HFA Inhale 2 puffs into the lungs every 6 (six) hours as needed for wheezing or shortness of breath.   apixaban 5 MG Tabs tablet Commonly known as: Eliquis Take 1 tablet (5 mg total) by mouth 2 (two) times daily.   aspirin 81 MG chewable tablet Chew by mouth daily.   atorvastatin 20 MG tablet Commonly known as: LIPITOR Take 1 tablet (20 mg total) by mouth daily.   COENZYME Q10 PO Take 1 capsule by mouth daily.   multivitamin with minerals Tabs tablet Take 1 tablet by mouth daily.   traZODone 100 MG tablet Commonly known as: DESYREL Take 1 tablet (100 mg total) by mouth at bedtime as needed for sleep.       Allergies  Allergen Reactions  . Xarelto [Rivaroxaban] Other (See Comments)    Difficulty swallowing     Consultations:  GI   Procedures/Studies:  No results found.    Subjective: Patient tolerating diet without complication said he feels fine stable for discharge  Discharge Exam: Vitals:   01/25/19 2114 01/26/19 0437  BP: 110/70 94/71  Pulse:  (!) 55  Resp: 16 18  Temp: 98.2 F (36.8 C) 98.3 F (36.8 C)  SpO2: 95% 95%   Vitals:   01/25/19 1340 01/25/19 1800 01/25/19 2114 01/26/19 0437  BP: 125/79 114/72 110/70 94/71  Pulse: (!) 57 63 65 (!) 55  Resp: 14 15 16 18   Temp:  98.9 F (37.2 C) 98.2 F (36.8 C) 98.3 F (36.8 C)  TempSrc:  Oral Oral Oral  SpO2: 100% 99% 95% 95%  Weight:      Height:        General: Pt is alert, awake, not in acute distress Cardiovascular: RRR, S1/S2 +, no rubs, no gallops Respiratory: CTA bilaterally, no wheezing, no rhonchi Abdominal: Soft, NT, ND, bowel sounds + Extremities: no edema, no cyanosis    The results of significant diagnostics from this hospitalization (including imaging, microbiology, ancillary and laboratory) are listed below for reference.     Microbiology: Recent Results (from the past 240 hour(s))  SARS Coronavirus 2 (CEPHEID - Performed in [facility]), [facility]  Status: None   Collection Time: 01/23/19  2:25 PM   Specimen: Nasopharyngeal Swab  Result Value Ref Range Status   SARS Coronavirus 2 NEGATIVE NEGATIVE Final    Comment: (NOTE) If result is NEGATIVE SARS-CoV-2 target nucleic acids are NOT DETECTED. The SARS-CoV-2 RNA is generally detectable in upper and lower  respiratory specimens during the acute phase of infection. The lowest  concentration of SARS-CoV-2 viral copies this assay can detect is 250  copies / mL. A negative result does not preclude SARS-CoV-2 infection  and should not be used as the sole basis for treatment or other  patient management decisions.  A negative result may occur with  improper specimen collection / handling, submission of specimen other  than  nasopharyngeal swab, presence of viral mutation(s) within the  areas targeted by this assay, and inadequate number of viral copies  (<250 copies / mL). A negative result must be combined with clinical  observations, patient history, and epidemiological information. If result is POSITIVE SARS-CoV-2 target nucleic acids are DETECTED. The SARS-CoV-2 RNA is generally detectable in upper and lower  respiratory specimens dur ing the acute phase of infection.  Positive  results are indicative of active infection with SARS-CoV-2.  Clinical  correlation with patient history and other diagnostic information is  necessary to determine patient infection status.  Positive results do  not rule out bacterial infection or co-infection with other viruses. If result is PRESUMPTIVE POSTIVE SARS-CoV-2 nucleic acids MAY BE PRESENT.   A presumptive positive result was obtained on the submitted specimen  and confirmed on repeat testing.  While 2019 novel coronavirus  (SARS-CoV-2) nucleic acids may be present in the submitted sample  additional confirmatory testing may be necessary for epidemiological  and / or clinical management purposes  to differentiate between  SARS-CoV-2 and other Sarbecovirus currently known to infect humans.  If clinically indicated additional testing with an alternate test  methodology [phone]) is advised. The SARS-CoV-2 RNA is generally  detectable in upper and lower respiratory sp ecimens during the acute  phase of infection. The expected result is Negative. Fact Sheet for Patients:  StrictlyIdeas.no Fact Sheet for Healthcare Providers: BankingDealers.co.za This test is not yet approved or cleared by the [place] FDA and has been authorized for detection and/or diagnosis of SARS-CoV-2 by FDA under an Emergency Use Authorization (EUA).  This EUA will remain in effect (meaning this test can be used) for the duration of  the COVID-19 declaration under Section 564(b)(1) of the Act, 21 U.S.C. section 360bbb-3(b)(1), unless the authorization is terminated or revoked sooner. Performed at [facility], [place] [address], [place], [place] [address]      Labs: BNP (last 3 results) Recent Labs    08/12/18 2002 12/02/18 2055  BNP 60.4 41.6   Basic Metabolic Panel: Recent Labs  Lab 01/23/19 1214 01/23/19 1323 01/24/19 0552  NA 136  --  135  K 2.8*  --  3.8  CL 80*  --  98  CO2 31  --  28  GLUCOSE 141*  --  123*  BUN 31*  --  18  CREATININE 1.39*  --  0.76  CALCIUM 8.8*  --  8.3*  MG  --  2.6* 2.7*  PHOS  --   --  1.7*   Liver Function Tests: Recent Labs  Lab 01/23/19 1214  AST 22  ALT 19  ALKPHOS 90  BILITOT 2.7*  PROT 8.4*  ALBUMIN 4.6   No results for input(s): LIPASE,  AMYLASE in the last 168 hours. No results for input(s): AMMONIA in the last 168 hours. CBC: Recent Labs  Lab 01/23/19 1214  01/24/19 0552 01/24/19 1147 01/24/19 1858 01/25/19 0032 01/25/19 0543 01/26/19 0818  WBC 16.1*  --  10.9*  --   --   --   --   --   NEUTROABS 13.5*  --   --   --   --   --   --   --   HGB 16.1   < > 13.0 13.5 12.7* 12.7* 12.7* 12.5*  HCT 47.7   < > 39.7 41.0 38.3* 39.8 40.4 39.9  MCV 83.7  --  87.1  --   --   --   --   --   PLT 297  --  188  --   --   --   --   --    < > = values in this interval not displayed.   Cardiac Enzymes: No results for input(s): CKTOTAL, CKMB, CKMBINDEX, TROPONINI in the last 168 hours. BNP: Invalid input(s): POCBNP CBG: No results for input(s): GLUCAP in the last 168 hours. D-Dimer No results for input(s): DDIMER in the last 72 hours. Hgb A1c No results for input(s): HGBA1C in the last 72 hours. Lipid Profile No results for input(s): CHOL, HDL, LDLCALC, TRIG, CHOLHDL, LDLDIRECT in the last 72 hours. Thyroid function studies No results for input(s): TSH, T4TOTAL, T3FREE, THYROIDAB in the last 72 hours.  Invalid input(s):  FREET3 Anemia work up No results for input(s): VITAMINB12, FOLATE, FERRITIN, TIBC, IRON, RETICCTPCT in the last 72 hours. Urinalysis No results found for: COLORURINE, APPEARANCEUR, LABSPEC, PHURINE, GLUCOSEU, HGBUR, BILIRUBINUR, KETONESUR, PROTEINUR, UROBILINOGEN, NITRITE, LEUKOCYTESUR Sepsis Labs Invalid input(s): PROCALCITONIN,  WBC,  LACTICIDVEN Microbiology Recent Results (from the past 240 hour(s))  SARS Coronavirus 2 (CEPHEID - Performed in [facility] lab), Hosp Order     Status: None   Collection Time: 01/23/19  2:25 PM   Specimen: Nasopharyngeal Swab  Result Value Ref Range Status   SARS Coronavirus 2 NEGATIVE NEGATIVE Final    Comment: (NOTE) If result is NEGATIVE SARS-CoV-2 target nucleic acids are NOT DETECTED. The SARS-CoV-2 RNA is generally detectable in upper and lower  respiratory specimens during the acute phase of infection. The lowest  concentration of SARS-CoV-2 viral copies this assay can detect is 250  copies / mL. A negative result does not preclude SARS-CoV-2 infection  and should not be used as the sole basis for treatment or other  patient management decisions.  A negative result may occur with  improper specimen collection / handling, submission of specimen other  than nasopharyngeal swab, presence of viral mutation(s) within the  areas targeted by this assay, and inadequate number of viral copies  (<250 copies / mL). A negative result must be combined with clinical  observations, patient history, and epidemiological information. If result is POSITIVE SARS-CoV-2 target nucleic acids are DETECTED. The SARS-CoV-2 RNA is generally detectable in upper and lower  respiratory specimens dur ing the acute phase of infection.  Positive  results are indicative of active infection with SARS-CoV-2.  Clinical  correlation with patient history and other diagnostic information is  necessary to determine patient infection status.  Positive results do  not rule  out bacterial infection or co-infection with other viruses. If result is PRESUMPTIVE POSTIVE SARS-CoV-2 nucleic acids MAY BE PRESENT.   A presumptive positive result was obtained on the submitted specimen  and confirmed on repeat testing.  While  2019 novel coronavirus  (SARS-CoV-2) nucleic acids may be present in the submitted sample  additional confirmatory testing may be necessary for epidemiological  and / or clinical management purposes  to differentiate between  SARS-CoV-2 and other Sarbecovirus currently known to infect humans.  If clinically indicated additional testing with an alternate test  methodology [phone] is advised. The SARS-CoV-2 RNA is generally  detectable in upper and lower respiratory sp ecimens during the acute  phase of infection. The expected result is Negative. Fact Sheet for Patients:  BoilerBrush.com.cyhttps://www.fda.gov/media/136312/download Fact Sheet for Healthcare Providers: https://pope.com/https://www.fda.gov/media/136313/download This test is not yet approved or cleared by the [place] FDA and has been authorized for detection and/or diagnosis of SARS-CoV-2 by FDA under an Emergency Use Authorization (EUA).  This EUA will remain in effect (meaning this test can be used) for the duration of the COVID-19 declaration under Section 564(b)(1) of the Act, 21 U.S.C. section 360bbb-3(b)(1), unless the authorization is terminated or revoked sooner. Performed at [facility], [address] [place], [place] [address]      Time coordinating discharge: Over 30 minutes  SIGNED:   Burke Keelshristopher Miri Jose, MD  [facility] 01/26/2019, 1:39 PM Pager   If 7PM-7AM, please contact night-coverage [email or web address] Password [email or web address]

## 2019-01-27 ENCOUNTER — Telehealth: Payer: Self-pay | Admitting: Critical Care Medicine

## 2019-01-27 NOTE — Telephone Encounter (Signed)
Attempted to reach patient to schedule appt no answer

## 2019-01-27 NOTE — Telephone Encounter (Signed)
-----   Message from Elsie Stain, MD sent at 01/26/2019  2:03 PM EDT ----- Regarding: needs post hosp appt This patient needs a post hospital appt with me next week.  Just d/c from hosp today  Dr Joya Gaskins

## 2019-03-16 ENCOUNTER — Ambulatory Visit: Payer: BC Managed Care – PPO | Admitting: Critical Care Medicine

## 2019-03-17 ENCOUNTER — Ambulatory Visit: Payer: Self-pay | Admitting: Critical Care Medicine

## 2019-03-17 NOTE — Progress Notes (Deleted)
Subjective:    Patient ID: Randall Simpson, male    DOB: 05/03/1966, 53 y.o.   MRN: [id number]  53 y.o.M with history of alcohol use and associated withdrawal syndrome after having been recently discharged from the hospital in February for alcohol withdrawal.  He then subsequently had deep venous thrombosis and subsegmental pulmonary emboli diagnosed.  The DVT was in the left lower extremity.  The patient had difficulty with Xarelto and had lip swelling and throat pain.  When I saw the patient March 2 we discontinued the Xarelto and switch the patient over to Eliquis.  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  The patient is stayed in the [place] area more recently.  Note the patient did have a myocardial infarction 2 years ago in [place] with coronary disease.  Note the patient now is on hypercholesterolemia medication.  The patient states he recently was in alcohol rehab for 7 days and now is in a halfway house for sober living.  Patient is now off Librium.  He does complain of bilateral foot pain which gabapentin did not help.  He had increased edema to lower extremities with gabapentin.  He went to the emergency room twice on the third and 4 June for lower extremity edema.  Work-up did not show recurrence of DVT  Since gabapentin was stopped and Eliquis resumed his swelling is gone down in the lower extremities.  Note this patient had been off Eliquis for 2 weeks but he is now back on the Eliquis.  Note the patient now has insurance.   The patient does state he has been diagnosed with COPD previously with his smoking use.  He has been on albuterol only as needed.  He still is coughing up brown mucus and is short of breath with exertion.  He has some wheezing.  He states he did have pulmonary functions in [place].  He has not seen a lung doctor since he has been in this area till he came to see me  in March.  03/17/2019  DVT (deep venous thrombosis) (HCC) History of left lower extremity deep venous thrombosis with sub-segmental pulmonary embolism  Current plan is to continue Eliquis for at least 1 year at 5 mg twice daily  At some point will need to assess further hypercoagulable state    COPD with chronic bronchitis (HCC) Mild COPD exacerbation currently stable now however would benefit from controller therapy  We will begin Symbicort 2 inhalations twice daily 160 mcg strength  We will refill the albuterol  Alcohol use with alcohol-induced mood disorder (HCC) History of alcohol use now improved status post rehab stay  Tobacco abuse Tobacco use  Smoking cessation counseling given to the patient  Plantar fasciitis Bilateral mild plantar fasciitis  I recommended an orthotic insert and as well Tylenol as needed for pain as we need to avoid nonsteroidal anti-inflammatories with the anticoagulation and aspirin use  Exercises for the fasciitis were also given  Since last OV: Adm for UGIB Admit date: 01/23/2019 Discharge date: 01/26/2019  Admitted From: Inpatient Disposition: home  Recommendations for Outpatient Follow-up:  1. Follow up with PCP in 1-2 weeks   Home Health:No Equipment/Devices:no new equip  Discharge Condition:Stable CODE STATUS:Full code Diet recommendation: soft diet  Brief/Interim Summary: HPIper admitting MD: Willian Simpson a 53 y.o.malewith medical history significant ofalcoholism with previous alcohol withdrawal problems, severe esophagitis with upper GI bleeding status post  [facility] in 08/2018 [facility], DVT and subsegmental PE diagnosed 08/2018, stayed sober for last 2 months, started drinking about a week ago, heavy drinking 6-10 beers a day presenting today to the hospital with multiple dark emesis with streaks of blood. According to the patient, he started feeling nauseated since yesterday and he  thought he should go to rehab so [facility]hab, however when he was in the reception, he started vomiting and it was dark red blood with multiple episodes, he felt weak and shaky so [facility]. Patient denies any similar events since last endoscopy. He is not taking any Protonix as prescribed. He is however religiously taking his blood thinners. Patient denies any abdominal pain. He has some dull retrosternal pain with vomiting. Denies any bowel changes. He has no fever or chills. No chest pain. No respiratory symptoms  Hospital course: Acute upper GI bleeding:AS PREVIOUSLY NOTED,history of grade D esophagitis, on anticoagulationPTA Probable source of bleeding is esophageal erosion. No history of varices. Mild decline in hemoglobin although patient was hemoconcentrated on admission Patient received 2 L of normal saline, and maintenance IV fluids. Case discussed with gastroenterology,EGD with distal ulcerative esophagitis without active bleeding but evidence of recent bleeding on discharge patient will be on twice daily PPI, GI cleared initiation of anticoagulation given patient's history of DVT and PE, patient received strong recommendations for alcohol cessation  2.History of DVT and PE: Patient's anticoagulation initially held secondary to bleeding.  We will reinitiate it with close follow-up with his PCP for outpatient follow-up and possible discontinuation.  An extensive discussion with the patient on the importance of close follow-up for further evaluation.  Also had extensive discussion on importance of alcohol abstinence and risk of rebleeding.  3.Alcoholism with risk of alcohol withdrawal: Patient at high risk of withdrawal.  Was placed on fall precautions, CIWA protocol with benzodiazepines multivitamins.  Patient stable will be discharged home today.  He will follow-up outpatient with his provider for  further outpatient treatments.  There is no indication for acute medical detox at this point.  4.Hypokalemia: Replaced aggressively and monitor levels.  5.Smoker: Counseled. Patient received tobacco cessation counseling of 10 minutes duration, did not appear interested in cessation at this point regardless encouraged cessation.. Patient was given  nicotine patch.  6.Acute blood loss anemia. Secondary to #1. Follow hemoglobin as above, , ppi GTT while in the hospital, hemoglobin is remained stable at 12.74 and 12.5 this morning.  No further evidence of bleeding.  Discharge Diagnoses:  Principal Problem:   Acute upper GI bleeding Active Problems:   DVT (deep venous thrombosis) (HCC)   Alcohol abuse   Tobacco abuse   Single subsegmental pulmonary embolism without acute cor pulmonale   Hypokalemia   GI (gastrointestinal bleed)    Past Medical History:  Diagnosis Date   Alcohol withdrawal (HCC) 08/14/2018   Alcohol withdrawal syndrome, with delirium (HCC) 08/03/2018   Atrial fibrillation (HCC)    Carotid artery disease (HCC)    DVT (deep venous thrombosis) (HCC)    ETOH abuse    MI (myocardial infarction) (HCC) 06/02/2017   [place]     Family History  Problem Relation Age of Onset   Cancer Mother    Cancer Father      Social History   Socioeconomic History   Marital status: Single    Spouse name: Not on file   Number of children:  Not on file   Years of education: Not on file   Highest education level: Not on file  Occupational History   Not on file  Social Needs   Financial resource strain: Not on file   Food insecurity    Worry: Not on file    Inability: Not on file   Transportation needs    Medical: Not on file    Non-medical: Not on file  Tobacco Use   Smoking status: Current Every Day Smoker    Packs/day: 0.50    Types: Cigarettes   Smokeless tobacco: Never Used  Substance and Sexual Activity   Alcohol use: Yes     Comment: daily use-8 pack of wine and several pints of beer and vodka    Drug use: Never   Sexual activity: Not Currently  Lifestyle   Physical activity    Days per week: Not on file    Minutes per session: Not on file   Stress: Not on file  Relationships   Social connections    Talks on phone: Not on file    Gets together: Not on file    Attends religious service: Not on file    Active member of club or organization: Not on file    Attends meetings of clubs or organizations: Not on file    Relationship status: Not on file   Intimate partner violence    Fear of current or ex partner: Not on file    Emotionally abused: Not on file    Physically abused: Not on file    Forced sexual activity: Not on file  Other Topics Concern   Not on file  Social History Narrative   Not on file     Allergies  Allergen Reactions   Xarelto [Rivaroxaban] Other (See Comments)    Difficulty swallowing     Outpatient Medications Prior to Visit  Medication Sig Dispense Refill   albuterol (PROVENTIL HFA;VENTOLIN HFA) 108 (90 Base) MCG/ACT inhaler Inhale 2 puffs into the lungs every 6 (six) hours as needed for wheezing or shortness of breath.     apixaban (ELIQUIS) 5 MG TABS tablet Take 1 tablet (5 mg total) by mouth 2 (two) times daily. 60 tablet 3   aspirin 81 MG chewable tablet Chew by mouth daily.     atorvastatin (LIPITOR) 20 MG tablet Take 1 tablet (20 mg total) by mouth daily. 90 tablet 3   COENZYME Q10 PO Take 1 capsule by mouth daily.     Multiple Vitamin (MULTIVITAMIN WITH MINERALS) TABS tablet Take 1 tablet by mouth daily.     pantoprazole (PROTONIX) 40 MG tablet Take 1 tablet (40 mg total) by mouth 2 (two) times daily. 180 tablet 3   traZODone (DESYREL) 100 MG tablet Take 1 tablet (100 mg total) by mouth at bedtime as needed for sleep. 30 tablet 6   No facility-administered medications prior to visit.       Review of Systems  Constitutional: Negative for appetite  change and fever.  HENT: Negative for facial swelling.   Respiratory: Positive for cough and shortness of breath.   Cardiovascular: Positive for leg swelling. Negative for chest pain.  Gastrointestinal: Negative.   Genitourinary: Negative.   Musculoskeletal:       Foot pain   Skin: Negative.   Neurological: Negative.   Psychiatric/Behavioral: Negative for confusion, decreased concentration, dysphoric mood, self-injury, sleep disturbance and suicidal ideas. The patient is not nervous/anxious.        Objective:  Physical Exam There were no vitals filed for this visit.  Gen: Pleasant, well-nourished, in no distress,  normal affect  ENT: No lesions,  mouth clear,  oropharynx clear, no postnasal drip  Neck: No JVD, no TMG, no carotid bruits  Lungs: No use of accessory muscles, no dullness to percussion, clear without rales or rhonchi  Cardiovascular: RRR, heart sounds normal, no murmur or gallops, no peripheral edema  Abdomen: soft and NT, no HSM,  BS normal  Musculoskeletal: No deformities, no cyanosis or clubbing Tender bilateral soles of feet  Neuro: alert, non focal  Skin: Warm, no lesions or rashes  2/12 CT Angio chest : Neg for PE DVT in LE  CT Angio 2/19 IMPRESSION: 1. Positive examination for pulmonary emboli. Distal segmental to subsegmental embolus present in the right lower lobe (series 5, image 227). This appears unchanged compared to prior examination. There is no definite embolus identified elsewhere.  2. RV LV ratio = 1.1. Correlate for clinical evidence of right heart strain and consider echocardiography, although heart strain secondary to pulmonary embolus seems unlikely small and distal burden of embolus.  3.  Bibasilar scarring or atelectasis.  These results were called by telephone at the time of interpretation on 08/19/2018 at 3:57 pm to Dr. Geoffery LyonsUGLAS DELO , who verbally acknowledged these results.   2/13 echo FINDINGS  Left Ventricle:  The left ventricle has normal systolic function, with an ejection fraction of 60-65%. The cavity size was normal. There is no increase in left ventricular wall thickness. Left ventricular diastolic parameters were normal Right Ventricle: The right ventricle has normal systolic function. The cavity was normal. There is no increase in right ventricular wall thickness. The right ventricle was not well visualized. Left Atrium: left atrial size was normal in size Right Atrium: right atrial size was normal in size Interatrial Septum: No atrial level shunt detected by color flow Doppler. Pericardium: There is no evidence of pericardial effusion. Mitral Valve: The mitral valve is normal in structure. Mitral valve regurgitation is trivial by color flow Doppler. Tricuspid Valve: The tricuspid valve is normal in structure. Tricuspid valve regurgitation is trivial by color flow Doppler. Aortic Valve: The aortic valve is tricuspid There is mild thickening of the aortic valve. Aortic valve regurgitation was not visualized by color flow Doppler. Pulmonic Valve: The pulmonic valve was normal in structure. Pulmonic valve regurgitation is not visualized by color flow Doppler. Venous: The inferior vena cava is normal in size with greater than 50% respiratory variability      Assessment & Plan:  I personally reviewed all images and lab data in the [facility] system as well as any outside material available during this office visit and agree with the  radiology impressions.   No problem-specific Assessment & Plan notes found for this encounter.   There are no diagnoses linked to this encounter. The patient now has private insurance and will focus on obtaining his own primary care provider

## 2019-10-30 IMAGING — CT CT ANGIO CHEST
2 of 6 series · 18 of 46 positions shown · IV contrast (ISOVUE)
Comparison: 08/12/2018

CLINICAL DATA: DVT, rule out PE

EXAM:
CT ANGIOGRAPHY CHEST WITH CONTRAST
TECHNIQUE: Multidetector CT imaging of the chest was performed using the
standard protocol during bolus administration of intravenous
contrast. Multiplanar CT image reconstructions and MIPs were
obtained to evaluate the vascular anatomy.
CONTRAST:  <See Chart> [id number] IOPAMIDOL ([id number]) INJECTION
76%

[Series 5: thins · axial · 0.84mm/px · z∈[-108,+172]mm · 15 of 309 slices shown]
[im 14/309  lung]
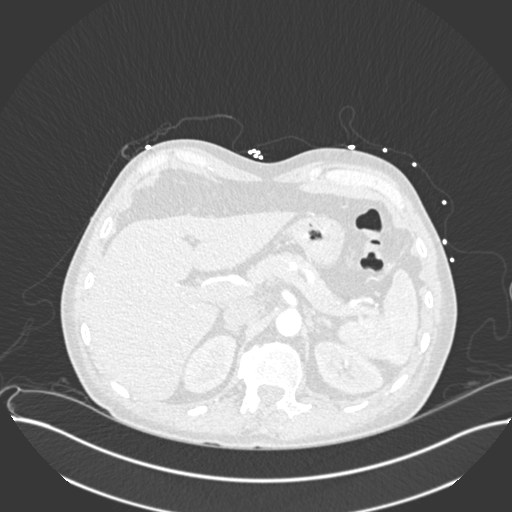
[im 41/309  soft-tissue]
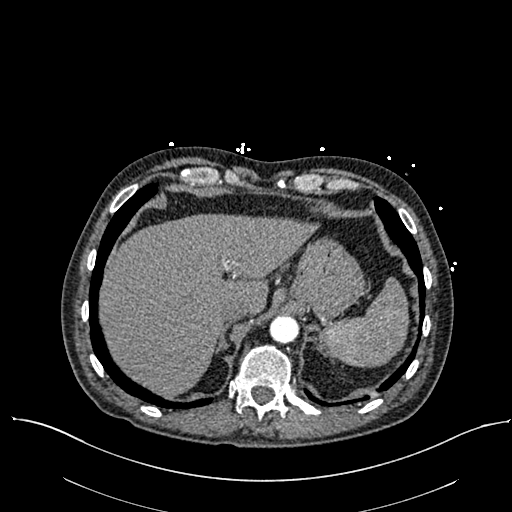
[im 54/309  lung]
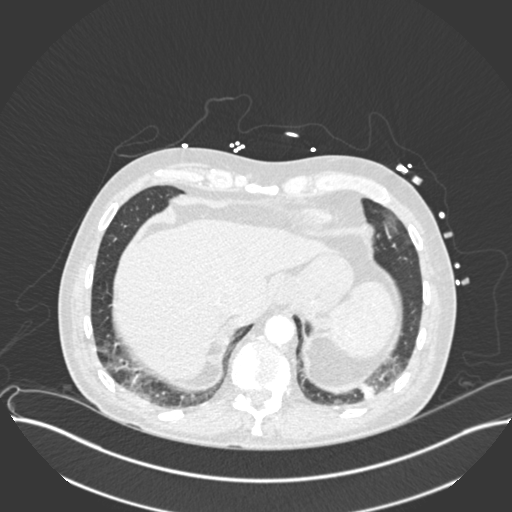
[im 81/309  soft-tissue]
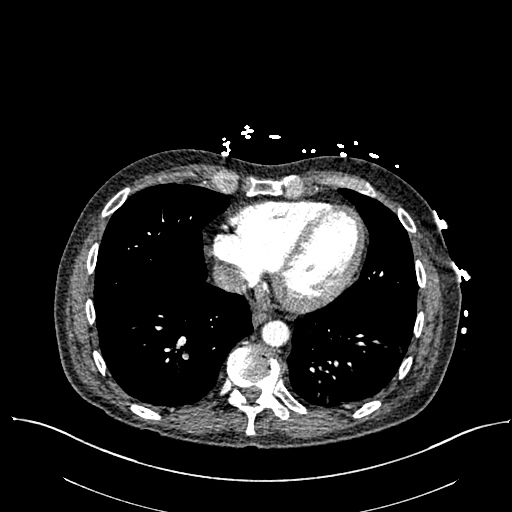
[im 94/309  lung]
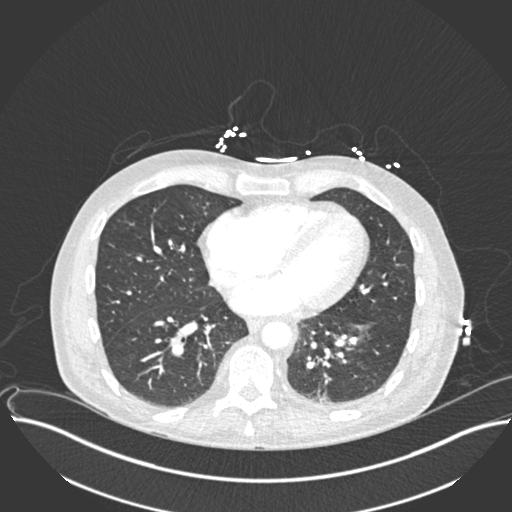
[im 121/309  soft-tissue]
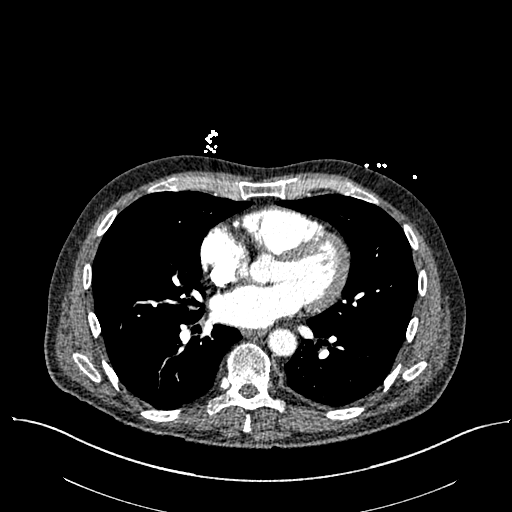
[im 134/309  lung]
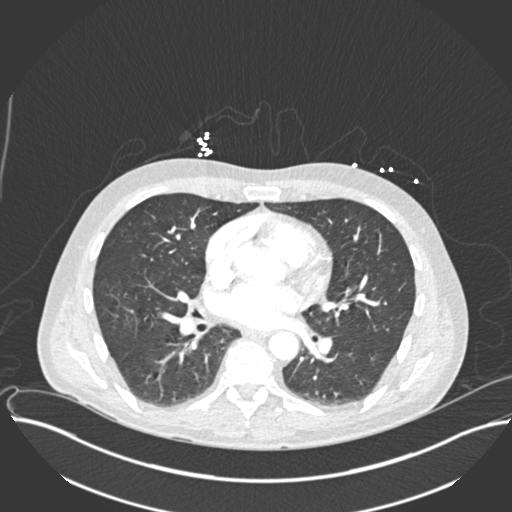
[im 161/309  soft-tissue]
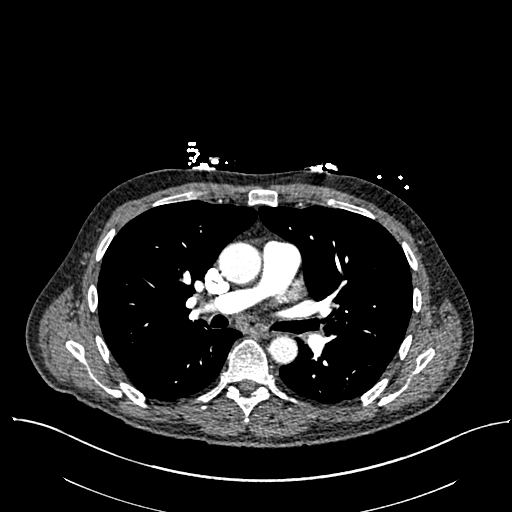
[im 175/309  lung]
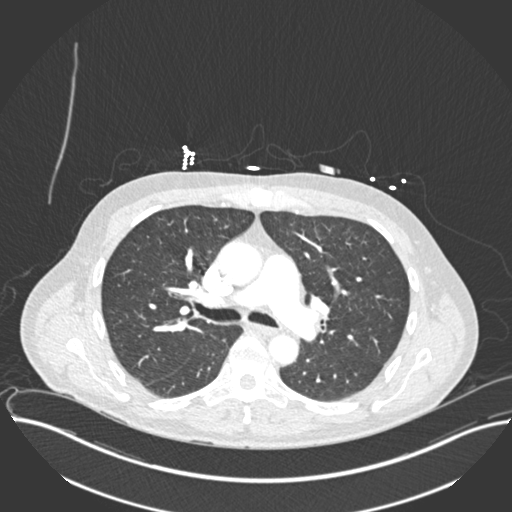
[im 188/309  soft-tissue]
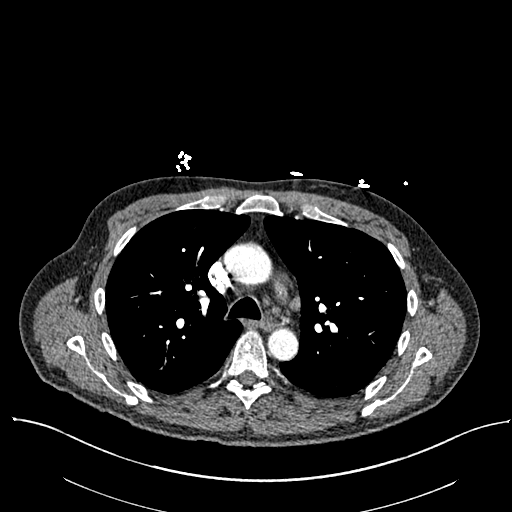
[im 215/309  lung]
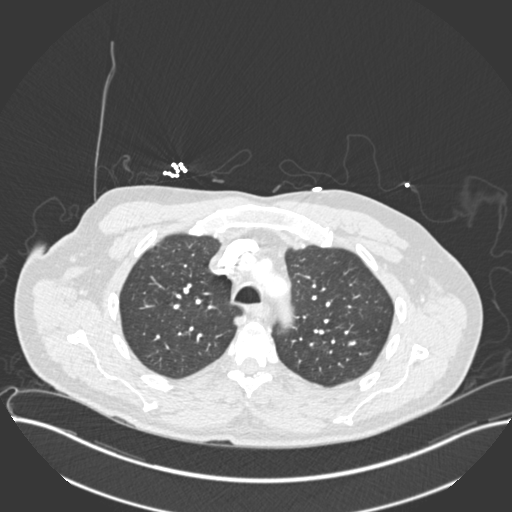
[im 228/309  soft-tissue]
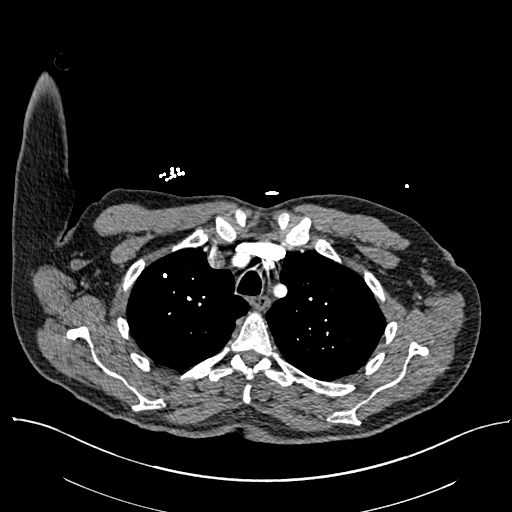
[im 255/309  lung]
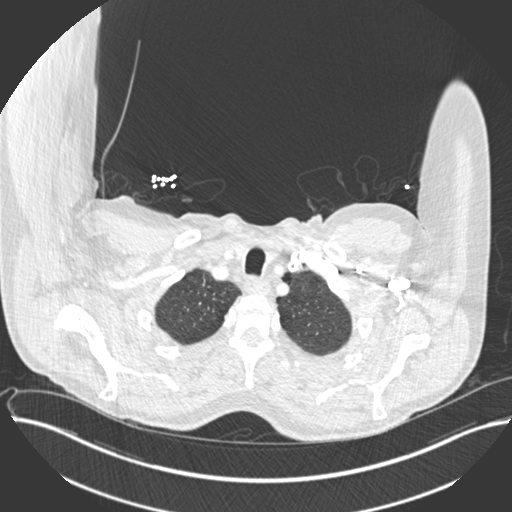
[im 268/309  soft-tissue]
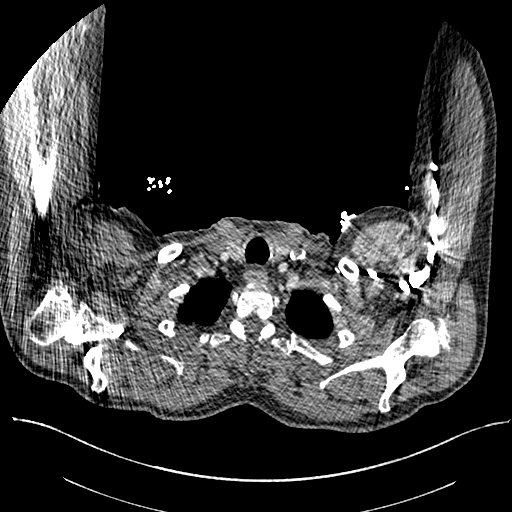
[im 295/309  lung]
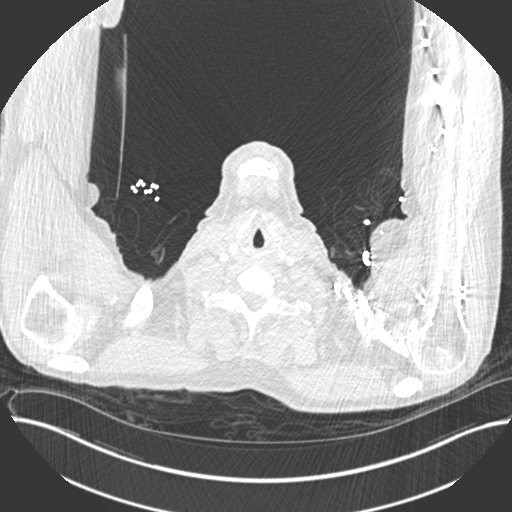

[Series 6: coronal mpr · coronal · 0.60mm/px · 3 of 132 slices shown]
[im 33/132  soft-tissue]
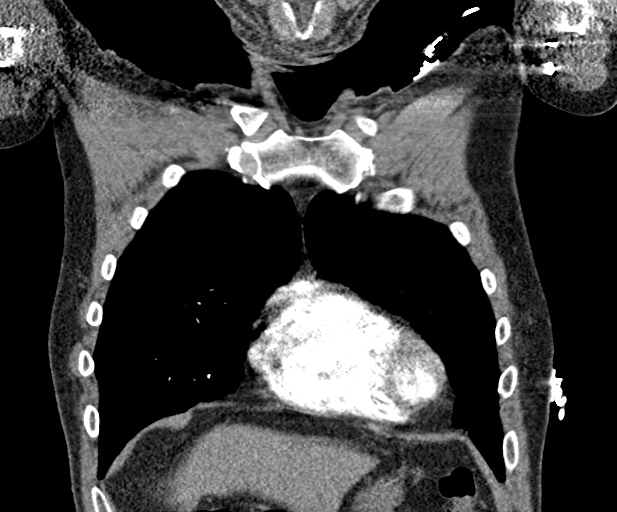
[im 66/132  soft-tissue]
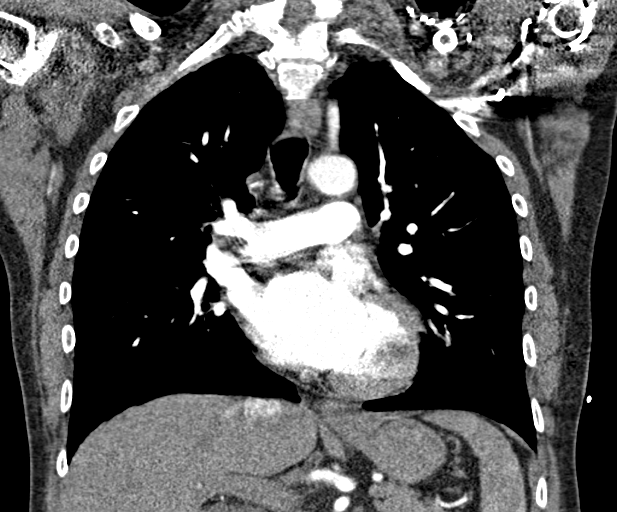
[im 99/132  soft-tissue]
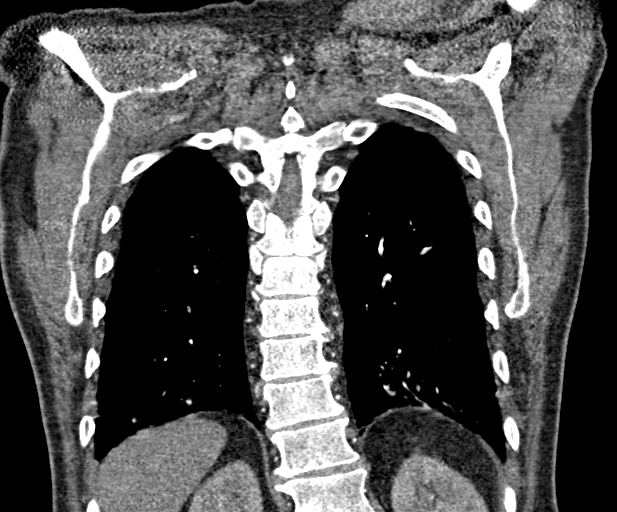

[18 of 46 positions shown; findings below may reference images not displayed]

FINDINGS: Cardiovascular: Positive examination for pulmonary emboli. Distal
segmental to subsegmental embolus present in the right lower lobe
(series 5, image 227). This appears unchanged compared to prior
examination. There is no definite embolus identified elsewhere. RV
LV ratio = 1.1. No pericardial effusion.

Mediastinum/Nodes: No enlarged mediastinal, hilar, or axillary lymph
nodes. Thyroid gland, trachea, and esophagus demonstrate no
significant findings.

Lungs/Pleura: Bibasilar scarring or atelectasis. No pleural effusion
or pneumothorax.

Upper Abdomen: No acute abnormality.

Musculoskeletal: No chest wall abnormality. No acute or significant
osseous findings.

Review of the MIP images confirms the above findings.
IMPRESSION: 1. Positive examination for pulmonary emboli. Distal segmental to
subsegmental embolus present in the right lower lobe (series 5,
image 227). This appears unchanged compared to prior examination.
There is no definite embolus identified elsewhere.

2. RV LV ratio = 1.1. Correlate for clinical evidence of right heart
strain and consider echocardiography, although heart strain
secondary to pulmonary embolus seems unlikely small and distal
burden of embolus.

3.  Bibasilar scarring or atelectasis.

These results were called by telephone at the time of interpretation
on 08/19/2018 at [DATE] to Dr. BIDJAI VERSTEEGE , who verbally
acknowledged these results.

## 2019-12-01 IMAGING — CR CHEST - 2 VIEW
2 series · 2 of 2 positions shown · non-contrast
Comparison: 08/19/2018

CLINICAL DATA: Shortness of breath and cough

EXAM:
CHEST - 2 VIEW

[w chest pa]
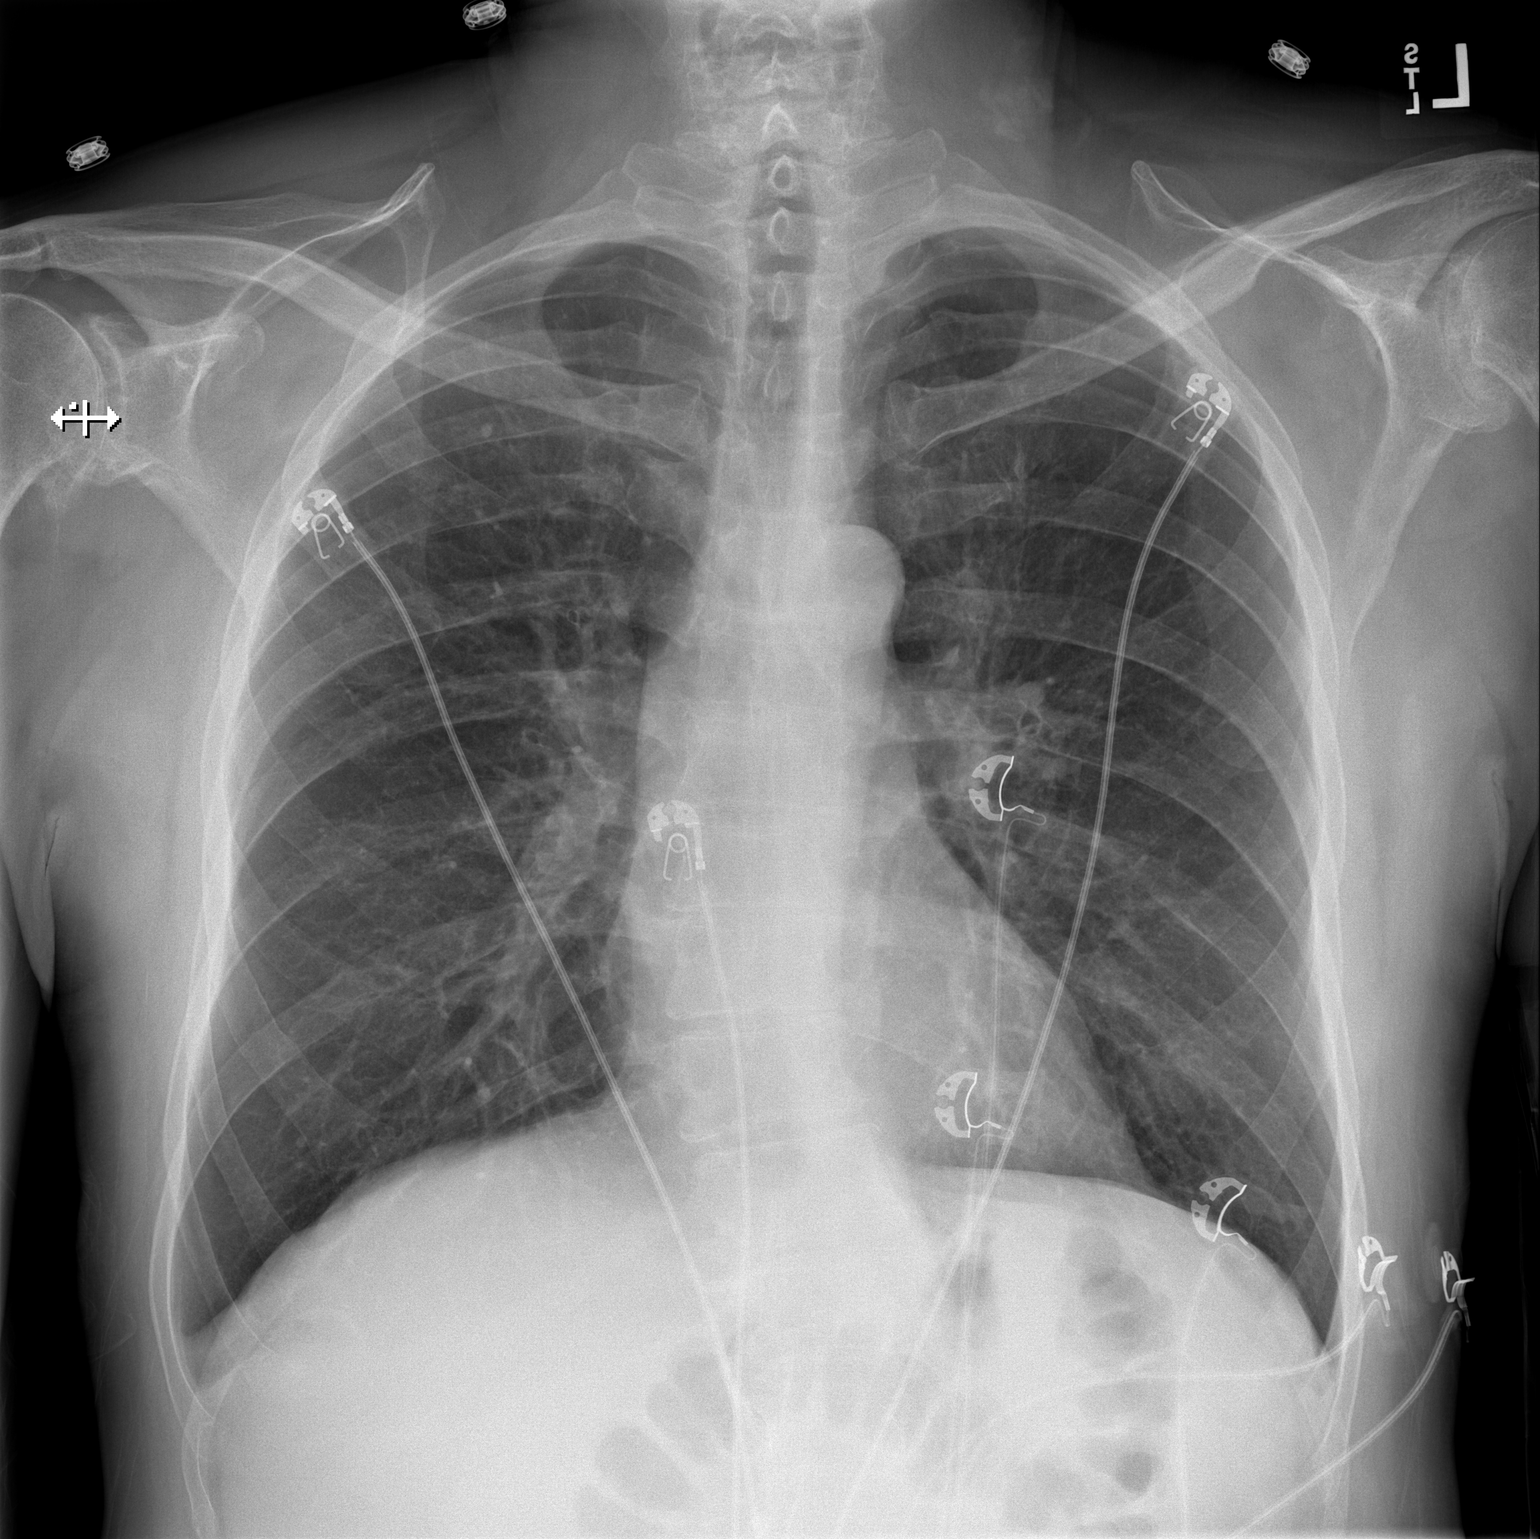

[w chest lat]
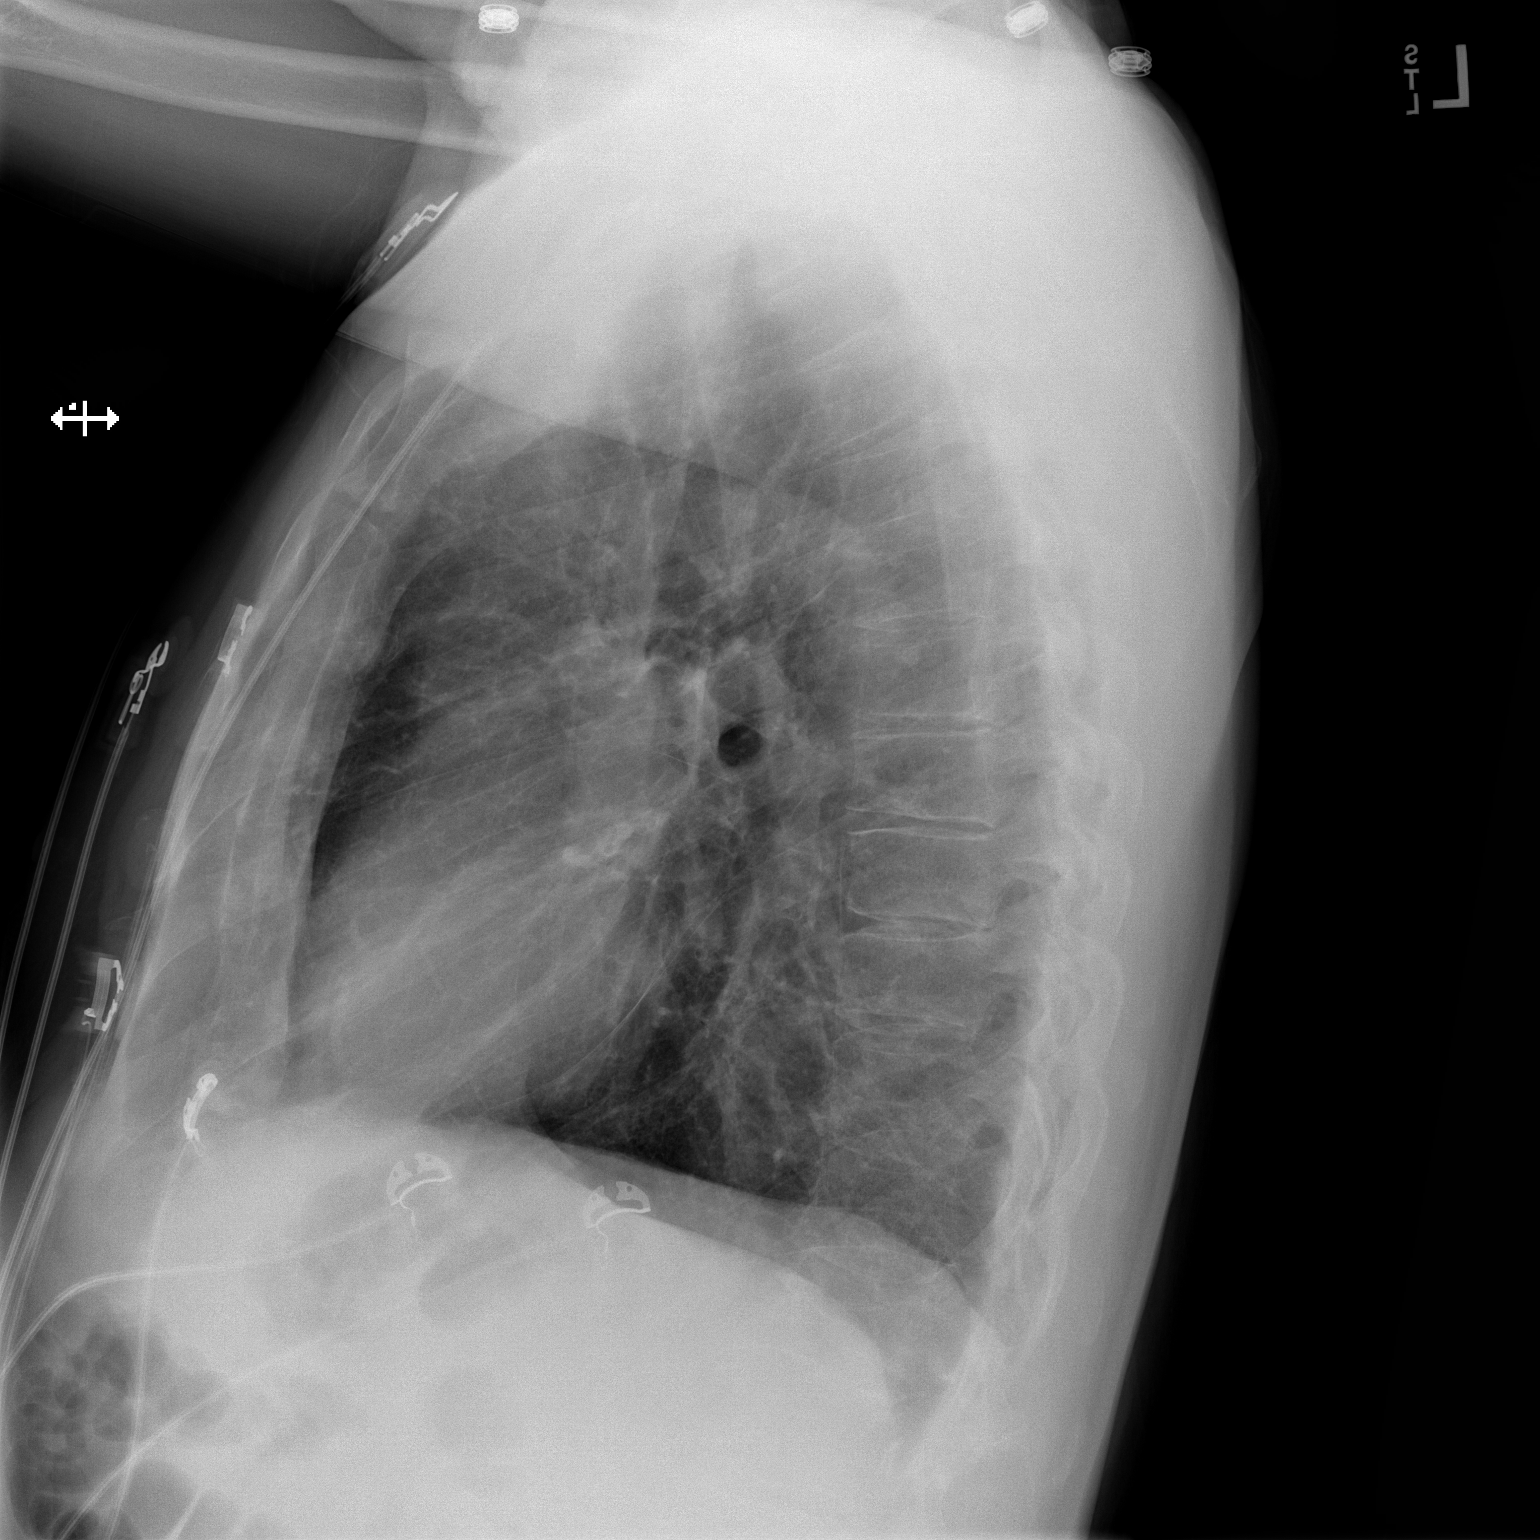

[2 of 2 positions shown; findings below may reference images not displayed]

FINDINGS: Cardiac shadows within normal limits. The lungs are mildly
hyperinflated without focal infiltrate or effusion. No acute bony
abnormality is noted.
IMPRESSION: COPD without acute.
